# Patient Record
Sex: Female | Born: 1960
Health system: Southern US, Community
[De-identification: ages and names within clinical notes are randomized; demographics above are authoritative.]

## PROBLEM LIST (undated history)

## (undated) DIAGNOSIS — K219 Gastro-esophageal reflux disease without esophagitis: Secondary | ICD-10-CM

## (undated) DIAGNOSIS — E785 Hyperlipidemia, unspecified: Secondary | ICD-10-CM

## (undated) DIAGNOSIS — Z86010 Personal history of colonic polyps: Secondary | ICD-10-CM

## (undated) DIAGNOSIS — I10 Essential (primary) hypertension: Secondary | ICD-10-CM

## (undated) DIAGNOSIS — T7840XA Allergy, unspecified, initial encounter: Secondary | ICD-10-CM

## (undated) HISTORY — DX: Allergy, unspecified, initial encounter: T78.40XA

## (undated) HISTORY — PX: COLONOSCOPY: SHX174

## (undated) HISTORY — PX: LAPAROSCOPIC TOTAL HYSTERECTOMY: SUR800

## (undated) HISTORY — DX: Personal history of colonic polyps: Z86.010

## (undated) HISTORY — DX: Gastro-esophageal reflux disease without esophagitis: K21.9

## (undated) HISTORY — DX: Hyperlipidemia, unspecified: E78.5

## (undated) HISTORY — PX: HEMORRHOID SURGERY: SHX153

---

## 1998-08-30 ENCOUNTER — Other Ambulatory Visit: Admission: RE | Admit: 1998-08-30 | Discharge: 1998-08-30 | Payer: Self-pay | Admitting: Obstetrics and Gynecology

## 1998-11-05 ENCOUNTER — Observation Stay (HOSPITAL_COMMUNITY): Admission: RE | Admit: 1998-11-05 | Discharge: 1998-11-06 | Payer: Self-pay | Admitting: Obstetrics and Gynecology

## 1999-11-07 ENCOUNTER — Other Ambulatory Visit: Admission: RE | Admit: 1999-11-07 | Discharge: 1999-11-07 | Payer: Self-pay | Admitting: Obstetrics and Gynecology

## 2000-10-21 ENCOUNTER — Ambulatory Visit (HOSPITAL_COMMUNITY): Admission: RE | Admit: 2000-10-21 | Discharge: 2000-10-21 | Payer: Self-pay | Admitting: Obstetrics and Gynecology

## 2001-01-04 ENCOUNTER — Other Ambulatory Visit: Admission: RE | Admit: 2001-01-04 | Discharge: 2001-01-04 | Payer: Self-pay | Admitting: Obstetrics and Gynecology

## 2001-06-30 ENCOUNTER — Ambulatory Visit (HOSPITAL_COMMUNITY): Admission: RE | Admit: 2001-06-30 | Discharge: 2001-06-30 | Payer: Self-pay | Admitting: Obstetrics and Gynecology

## 2002-04-21 ENCOUNTER — Other Ambulatory Visit: Admission: RE | Admit: 2002-04-21 | Discharge: 2002-04-21 | Payer: Self-pay | Admitting: Obstetrics and Gynecology

## 2003-05-31 ENCOUNTER — Other Ambulatory Visit: Admission: RE | Admit: 2003-05-31 | Discharge: 2003-05-31 | Payer: Self-pay | Admitting: Obstetrics and Gynecology

## 2004-05-23 ENCOUNTER — Other Ambulatory Visit: Admission: RE | Admit: 2004-05-23 | Discharge: 2004-05-23 | Payer: Self-pay | Admitting: Obstetrics and Gynecology

## 2004-09-10 ENCOUNTER — Other Ambulatory Visit: Admission: RE | Admit: 2004-09-10 | Discharge: 2004-09-10 | Payer: Self-pay | Admitting: Obstetrics and Gynecology

## 2005-02-20 ENCOUNTER — Other Ambulatory Visit: Admission: RE | Admit: 2005-02-20 | Discharge: 2005-02-20 | Payer: Self-pay | Admitting: Obstetrics and Gynecology

## 2005-08-13 ENCOUNTER — Other Ambulatory Visit: Admission: RE | Admit: 2005-08-13 | Discharge: 2005-08-13 | Payer: Self-pay | Admitting: Obstetrics and Gynecology

## 2005-08-27 ENCOUNTER — Ambulatory Visit: Payer: Self-pay | Admitting: Internal Medicine

## 2005-09-08 ENCOUNTER — Ambulatory Visit: Payer: Self-pay | Admitting: Internal Medicine

## 2012-03-06 ENCOUNTER — Other Ambulatory Visit: Payer: Self-pay | Admitting: Emergency Medicine

## 2012-03-31 ENCOUNTER — Other Ambulatory Visit: Payer: Self-pay | Admitting: Emergency Medicine

## 2012-04-07 ENCOUNTER — Other Ambulatory Visit: Payer: Self-pay | Admitting: Emergency Medicine

## 2012-04-08 NOTE — Telephone Encounter (Signed)
Needs office visit.

## 2012-04-14 ENCOUNTER — Ambulatory Visit: Payer: 59

## 2012-04-14 ENCOUNTER — Other Ambulatory Visit: Payer: Self-pay | Admitting: Emergency Medicine

## 2012-04-14 ENCOUNTER — Encounter: Payer: Self-pay | Admitting: Radiology

## 2012-04-14 ENCOUNTER — Ambulatory Visit (INDEPENDENT_AMBULATORY_CARE_PROVIDER_SITE_OTHER): Payer: 59 | Admitting: Emergency Medicine

## 2012-04-14 VITALS — BP 118/88 | HR 73 | Temp 98.5°F | Resp 16 | Ht 61.75 in | Wt 156.2 lb

## 2012-04-14 DIAGNOSIS — Z0000 Encounter for general adult medical examination without abnormal findings: Secondary | ICD-10-CM

## 2012-04-14 DIAGNOSIS — E785 Hyperlipidemia, unspecified: Secondary | ICD-10-CM

## 2012-04-14 DIAGNOSIS — M542 Cervicalgia: Secondary | ICD-10-CM

## 2012-04-14 DIAGNOSIS — IMO0001 Reserved for inherently not codable concepts without codable children: Secondary | ICD-10-CM

## 2012-04-14 LAB — POCT URINALYSIS DIPSTICK
Ketones, UA: NEGATIVE
Protein, UA: NEGATIVE
Spec Grav, UA: 1.015
pH, UA: 6

## 2012-04-14 LAB — COMPREHENSIVE METABOLIC PANEL
BUN: 12 mg/dL (ref 6–23)
CO2: 28 mEq/L (ref 19–32)
Creat: 0.79 mg/dL (ref 0.50–1.10)
Glucose, Bld: 89 mg/dL (ref 70–99)
Total Bilirubin: 0.5 mg/dL (ref 0.3–1.2)

## 2012-04-14 LAB — POCT CBC
Granulocyte percent: 51.9 %G (ref 37–80)
HCT, POC: 38.6 % (ref 37.7–47.9)
MCV: 91.8 fL (ref 80–97)
POC LYMPH PERCENT: 41.8 %L (ref 10–50)
RDW, POC: 13.1 %

## 2012-04-14 LAB — LIPID PANEL
Cholesterol: 268 mg/dL — ABNORMAL HIGH (ref 0–200)
HDL: 52 mg/dL (ref >39)
Total CHOL/HDL Ratio: 5.2 Ratio

## 2012-04-14 LAB — POCT UA - MICROSCOPIC ONLY: Crystals, Ur, HPF, POC: NEGATIVE

## 2012-04-14 LAB — TSH: TSH: 1.893 u[IU]/mL (ref 0.350–4.500)

## 2012-04-14 MED ORDER — ROSUVASTATIN CALCIUM 40 MG PO TABS: ORAL_TABLET | ORAL | Status: DC

## 2012-04-14 MED ORDER — OMEPRAZOLE 20 MG PO CPDR: 20.0000 mg | DELAYED_RELEASE_CAPSULE | Freq: Every day | ORAL | Status: DC

## 2012-04-14 NOTE — Progress Notes (Signed)
  Subjective:    Patient ID: Courtney Peterson, female    DOB: 02-15-1961, 51 y.o.   MRN: [id number]  HPI    Review of Systems  Constitutional: Positive for unexpected weight change.  HENT: Positive for ear pain, neck pain and dental problem.   Eyes: Negative.   Respiratory: Negative.   Cardiovascular: Negative.   Gastrointestinal: Negative.   Genitourinary: Positive for pelvic pain.  Musculoskeletal: Positive for back pain.  Skin: Negative.   Neurological: Negative.   Hematological: Negative.   Psychiatric/Behavioral: Negative.        Objective:   Physical Exam  Constitutional: She is oriented to person, place, and time. She appears well-developed and well-nourished.  HENT:  Head: Normocephalic.  Right Ear: External ear normal.  Left Ear: External ear normal.  Eyes: Pupils are equal, round, and reactive to light.  Neck: No tracheal deviation present.       There is tenderness over the neck muscles with diminished range of motion.  Cardiovascular: Normal rate and regular rhythm.  Exam reveals no gallop and no friction rub.   No murmur heard. Pulmonary/Chest: Effort normal and breath sounds normal.  Abdominal: Soft. Bowel sounds are normal. She exhibits no distension and no mass. There is no tenderness. There is no rebound and no guarding.  Musculoskeletal: Normal range of motion.  Neurological: She is alert and oriented to person, place, and time. No cranial nerve deficit.  Skin: Skin is warm and dry.  Psychiatric: She has a normal mood and affect. Her behavior is normal. Judgment and thought content normal.         [facility] reading (PRIMARY) by  Dr.Delmus Warwick no fracture seen no acute disease no arthritis.   Assessment & Plan:  Patient is due for her colonoscopy next year. She will continue on her Crestor 40 mg half tablet a day and Prilosec for reflux. She was given a neck manual to do neck exercises. She has seen her GYN and they will schedule her regular mammograms.

## 2012-04-15 ENCOUNTER — Encounter: Payer: Self-pay | Admitting: Emergency Medicine

## 2012-04-16 LAB — VITAMIN D 25 HYDROXY (VIT D DEFICIENCY, FRACTURES): Vit D, 25-Hydroxy: 28 ng/mL — ABNORMAL LOW (ref 30–89)

## 2012-07-23 ENCOUNTER — Encounter: Payer: Self-pay | Admitting: Internal Medicine

## 2012-08-23 ENCOUNTER — Encounter: Payer: Self-pay | Admitting: Internal Medicine

## 2012-10-21 ENCOUNTER — Encounter: Payer: Self-pay | Admitting: Internal Medicine

## 2012-12-09 ENCOUNTER — Ambulatory Visit (AMBULATORY_SURGERY_CENTER): Payer: 59

## 2012-12-09 VITALS — Ht 62.5 in | Wt 164.8 lb

## 2012-12-09 DIAGNOSIS — Z860100 Personal history of colon polyps, unspecified: Secondary | ICD-10-CM

## 2012-12-09 DIAGNOSIS — Z1211 Encounter for screening for malignant neoplasm of colon: Secondary | ICD-10-CM

## 2012-12-09 MED ORDER — NA SULFATE-K SULFATE-MG SULF 17.5-3.13-1.6 GM/177ML PO SOLN: 1.0000 | Freq: Once | ORAL | Status: DC

## 2012-12-10 ENCOUNTER — Encounter: Payer: Self-pay | Admitting: Internal Medicine

## 2012-12-27 ENCOUNTER — Ambulatory Visit (AMBULATORY_SURGERY_CENTER): Payer: 59 | Admitting: Internal Medicine

## 2012-12-27 ENCOUNTER — Encounter: Payer: Self-pay | Admitting: Internal Medicine

## 2012-12-27 VITALS — BP 102/74 | HR 52 | Temp 96.9°F | Resp 9 | Ht 62.0 in | Wt 164.0 lb

## 2012-12-27 DIAGNOSIS — Z1211 Encounter for screening for malignant neoplasm of colon: Secondary | ICD-10-CM

## 2012-12-27 DIAGNOSIS — D126 Benign neoplasm of colon, unspecified: Secondary | ICD-10-CM

## 2012-12-27 DIAGNOSIS — Z860100 Personal history of colon polyps, unspecified: Secondary | ICD-10-CM

## 2012-12-27 HISTORY — DX: Personal history of colonic polyps: Z86.010

## 2012-12-27 HISTORY — DX: Personal history of colon polyps, unspecified: Z86.0100

## 2012-12-27 MED ORDER — SODIUM CHLORIDE 0.9 % IV SOLN: 500.0000 mL | INTRAVENOUS | Status: DC

## 2012-12-27 NOTE — Op Note (Addendum)
[facility] [address] [place], [address]   COLONOSCOPY PROCEDURE REPORT  PATIENT: Peterson, Courtney J.  MR#: [id number] BIRTHDATE: 05-Sep-1961 , 52  yrs. old GENDER: Female ENDOSCOPIST: Iva Boop, MD, [facility] PROCEDURE DATE:  12/27/2012 PROCEDURE:   Colonoscopy with biopsy ASA CLASS:   Class II INDICATIONS:Screening and surveillance,personal history of colonic polyps. MEDICATIONS: propofol (Diprivan) 300mg  IV, MAC sedation, administered by CRNA, and These medications were titrated to patient response per physician's verbal order  DESCRIPTION OF PROCEDURE:   After the risks benefits and alternatives of the procedure were thoroughly explained, informed consent was obtained.  A digital rectal exam revealed no abnormalities of the rectum.   The LB CF-[id number]  endoscope was introduced through the anus and advanced to the cecum, which was identified by both the appendix and ileocecal valve. No adverse events experienced.   The quality of the prep was Suprep excellent The instrument was then slowly withdrawn as the colon was fully examined.      COLON FINDINGS: Two diminutive smooth sessile polyps were found at the cecum.  A polypectomy was performed with cold forceps.  The resection was complete and the polyp tissue was completely retrieved.   The colon mucosa was otherwise normal.   A right colon retroflexion was performed.  Retroflexed views revealed no abnormalities. The time to cecum=2 minutes 05 seconds.  Withdrawal time=9 minutes 40 seconds.  The scope was withdrawn and the procedure completed. COMPLICATIONS: There were no complications.  ENDOSCOPIC IMPRESSION: 1.   Two diminutive sessile polyps were found at the cecum; polypectomy was performed with cold forceps 2.   The colon mucosa was otherwise normal - excellent prep  RECOMMENDATIONS: Timing of repeat colonoscopy will be determined by pathology findings in this patient with prior hx  colon polyps   eSigned:  Iva Boop, MD, [facility] 12/27/2012 10:06 AM Revised: 12/27/2012 10:06 AM  cc: Lesle Chris, MD  Harold Hedge, MD and The Patient

## 2012-12-27 NOTE — Progress Notes (Signed)
Patient did not experience any of the following events: a burn prior to discharge; a fall within the facility; wrong site/side/patient/procedure/implant event; or a hospital transfer or hospital admission upon discharge from the facility. (G8907) Patient did not have preoperative order for IV antibiotic SSI prophylaxis. (G8918)  

## 2012-12-27 NOTE — Patient Instructions (Addendum)
Two tiny (possible) polyps were seen and removed. Otherwise normal colonoscopy with great prep.  I will let you know pathology results and when to have another routine colonoscopy by mail.  Thank you for choosing me and [facility].  Iva Boop, MD, [facility]  Discharge instructions given with  Verbal understanding. Handouts polyps given. Resume previous medications. YOU HAD AN ENDOSCOPIC PROCEDURE TODAY AT THE [facility]: Refer to the procedure report that was given to you for any specific questions about what was found during the examination.  If the procedure report does not answer your questions, please call your gastroenterologist to clarify.  If you requested that your care partner not be given the details of your procedure findings, then the procedure report has been included in a sealed envelope for you to review at your convenience later.  YOU SHOULD EXPECT: Some feelings of bloating in the abdomen. Passage of more gas than usual.  Walking can help get rid of the air that was put into your GI tract during the procedure and reduce the bloating. If you had a lower endoscopy (such as a colonoscopy or flexible sigmoidoscopy) you may notice spotting of blood in your stool or on the toilet paper. If you underwent a bowel prep for your procedure, then you may not have a normal bowel movement for a few days.  DIET: Your first meal following the procedure should be a light meal and then it is ok to progress to your normal diet.  A half-sandwich or bowl of soup is an example of a good first meal.  Heavy or fried foods are harder to digest and may make you feel nauseous or bloated.  Likewise meals heavy in dairy and vegetables can cause extra gas to form and this can also increase the bloating.  Drink plenty of fluids but you should avoid alcoholic beverages for 24 hours.  ACTIVITY: Your care partner should take you home directly after the procedure.  You should plan to  take it easy, moving slowly for the rest of the day.  You can resume normal activity the day after the procedure however you should NOT DRIVE or use heavy machinery for 24 hours (because of the sedation medicines used during the test).    SYMPTOMS TO REPORT IMMEDIATELY: A gastroenterologist can be reached at any hour.  During normal business hours, 8:30 AM to 5:00 PM Monday through Friday, call [phone].  After hours and on weekends, please call the GI answering service at [phone] who will take a message and have the physician on call contact you.   Following lower endoscopy (colonoscopy or flexible sigmoidoscopy):  Excessive amounts of blood in the stool  Significant tenderness or worsening of abdominal pains  Swelling of the abdomen that is new, acute  Fever of 100F or higher  FOLLOW UP: If any biopsies were taken you will be contacted by phone or by letter within the next 1-3 weeks.  Call your gastroenterologist if you have not heard about the biopsies in 3 weeks.  Our staff will call the home number listed on your records the next business day following your procedure to check on you and address any questions or concerns that you may have at that time regarding the information given to you following your procedure. This is a courtesy call and so if there is no answer at the home number and we have not heard from you through the emergency physician on call, we will assume that  you have returned to your regular daily activities without incident.  SIGNATURES/CONFIDENTIALITY: You and/or your care partner have signed paperwork which will be entered into your electronic medical record.  These signatures attest to the fact that that the information above on your After Visit Summary has been reviewed and is understood.  Full responsibility of the confidentiality of this discharge information lies with you and/or your care-partner.

## 2012-12-27 NOTE — Progress Notes (Signed)
Called to room to assist during endoscopic procedure.  Patient ID and intended procedure confirmed with present staff. Received instructions for my participation in the procedure from the performing physician.  

## 2012-12-28 ENCOUNTER — Telehealth: Payer: Self-pay | Admitting: *Deleted

## 2012-12-28 NOTE — Telephone Encounter (Signed)
  Follow up Call-  Call back number 12/27/2012  Post procedure Call Back phone  # [phone]  Permission to leave phone message Yes     Patient questions:  Do you have a fever, pain , or abdominal swelling? no Pain Score  0 *  Have you tolerated food without any problems? yes  Have you been able to return to your normal activities? yes  Do you have any questions about your discharge instructions: Diet   no Medications  no Follow up visit  no  Do you have questions or concerns about your Care? no  Actions: * If pain score is 4 or above: No action needed, pain <4.

## 2012-12-31 ENCOUNTER — Telehealth: Payer: Self-pay

## 2012-12-31 DIAGNOSIS — E785 Hyperlipidemia, unspecified: Secondary | ICD-10-CM

## 2012-12-31 NOTE — Telephone Encounter (Signed)
Patient of Dr. Cleta Alberts. Would like a call back about her cholesterol rx. CB # is [phone].

## 2013-01-02 ENCOUNTER — Encounter: Payer: Self-pay | Admitting: Internal Medicine

## 2013-01-02 NOTE — Progress Notes (Signed)
Quick Note:  Not polyps Repeat colonoscopy 2024 ______

## 2013-01-03 ENCOUNTER — Telehealth: Payer: Self-pay

## 2013-01-03 MED ORDER — ROSUVASTATIN CALCIUM 40 MG PO TABS: ORAL_TABLET | ORAL | Status: DC

## 2013-01-03 NOTE — Telephone Encounter (Signed)
Patient states the quantity of 15 is same cost as 3 month supply, 3 month supply sent in for her,she will call and make appt to follow up with Dr Cleta Alberts

## 2013-01-03 NOTE — Telephone Encounter (Signed)
PATIENT STATES SHE WANTS 40 MG OF CRESTOR FOR 30 DAYS. [phone]

## 2013-01-04 NOTE — Telephone Encounter (Signed)
Yesterday she wanted 90 day  Today she wants 30 day. Called her to advise #[phone] was sent in. She is going to call [facility].

## 2013-06-12 ENCOUNTER — Ambulatory Visit (INDEPENDENT_AMBULATORY_CARE_PROVIDER_SITE_OTHER): Payer: 59 | Admitting: Emergency Medicine

## 2013-06-12 ENCOUNTER — Ambulatory Visit: Payer: 59

## 2013-06-12 VITALS — BP 103/79 | HR 80 | Temp 98.4°F | Resp 18 | Wt 165.0 lb

## 2013-06-12 DIAGNOSIS — Z23 Encounter for immunization: Secondary | ICD-10-CM

## 2013-06-12 DIAGNOSIS — IMO0001 Reserved for inherently not codable concepts without codable children: Secondary | ICD-10-CM

## 2013-06-12 DIAGNOSIS — E785 Hyperlipidemia, unspecified: Secondary | ICD-10-CM | POA: Insufficient documentation

## 2013-06-12 DIAGNOSIS — K219 Gastro-esophageal reflux disease without esophagitis: Secondary | ICD-10-CM

## 2013-06-12 DIAGNOSIS — Z0000 Encounter for general adult medical examination without abnormal findings: Secondary | ICD-10-CM

## 2013-06-12 DIAGNOSIS — M549 Dorsalgia, unspecified: Secondary | ICD-10-CM

## 2013-06-12 DIAGNOSIS — E559 Vitamin D deficiency, unspecified: Secondary | ICD-10-CM | POA: Insufficient documentation

## 2013-06-12 LAB — POCT UA - MICROSCOPIC ONLY
Mucus, UA: NEGATIVE
Yeast, UA: NEGATIVE

## 2013-06-12 LAB — COMPREHENSIVE METABOLIC PANEL
AST: 16 U/L (ref 0–37)
Alkaline Phosphatase: 64 U/L (ref 39–117)
BUN: 14 mg/dL (ref 6–23)
Creat: 0.88 mg/dL (ref 0.50–1.10)
Glucose, Bld: 88 mg/dL (ref 70–99)

## 2013-06-12 LAB — POCT URINALYSIS DIPSTICK
Ketones, UA: NEGATIVE
Nitrite, UA: NEGATIVE
Protein, UA: NEGATIVE
Urobilinogen, UA: 0.2

## 2013-06-12 LAB — LIPID PANEL
Cholesterol: 197 mg/dL (ref 0–200)
HDL: 54 mg/dL (ref >39)
LDL Cholesterol: 113 mg/dL — ABNORMAL HIGH (ref 0–99)
Triglycerides: 151 mg/dL — ABNORMAL HIGH (ref <150)
VLDL: 30 mg/dL (ref 0–40)

## 2013-06-12 LAB — POCT CBC
HCT, POC: 43.6 % (ref 37.7–47.9)
Lymph, poc: 3 (ref 0.6–3.4)
MCH, POC: 28.9 pg (ref 27–31.2)
MCHC: 31.2 g/dL — AB (ref 31.8–35.4)
MCV: 92.5 fL (ref 80–97)
MID (cbc): 0.6 (ref 0–0.9)
POC LYMPH PERCENT: 38.1 %L (ref 10–50)
Platelet Count, POC: 233 10*3/uL (ref 142–424)
RDW, POC: 13.3 %
WBC: 7.8 10*3/uL (ref 4.6–10.2)

## 2013-06-12 MED ORDER — ZOSTER VACCINE LIVE 19400 UNT/0.65ML ~~LOC~~ SOLR: 0.6500 mL | Freq: Once | SUBCUTANEOUS | Status: DC

## 2013-06-12 MED ORDER — OMEPRAZOLE 20 MG PO CPDR: 20.0000 mg | DELAYED_RELEASE_CAPSULE | Freq: Every day | ORAL | Status: DC

## 2013-06-12 MED ORDER — ROSUVASTATIN CALCIUM 40 MG PO TABS: ORAL_TABLET | ORAL | Status: DC

## 2013-06-12 NOTE — Progress Notes (Signed)
Subjective:    Patient ID: Courtney Peterson, female    DOB: 12-18-1960, 52 y.o.   MRN: [id number]  HPI  52 YO female patient comes in today for a routine physical. Pt reports in she is in good health. She was working two jobs. She was recently married to a man in the [place] and she is working on getting him his green card.  Patient reports she will be getting to the eye doctor as she has noticed some blurred vision while watching TV.   Back pain in her mid left side, Knee pain happens once in a while but resolves with rest and ice. Patient reports headaches once in a while. She will take an OTC NSAID and finds relief.  Patient has a history of environmental allergies. She uses Claritin to alleviate the symptoms.   Colonoscopy 11/2012- Normal Mammogram scheduled for November Pap Smear 02/2013- Normal Tetanus within the last 5 years  Needs Shingles vaccine and Flu shot.    Review of Systems  Constitutional: Negative.   Eyes: Positive for visual disturbance. Negative for photophobia, pain, discharge, redness and itching.  Respiratory: Negative.   Cardiovascular: Negative.   Gastrointestinal: Negative.   Endocrine: Negative.   Genitourinary: Negative.   Musculoskeletal: Positive for back pain and joint swelling. Negative for myalgias, arthralgias and gait problem.  Skin: Negative.   Allergic/Immunologic: Positive for environmental allergies. Negative for food allergies and immunocompromised state.  Neurological: Positive for headaches. Negative for dizziness, tremors, seizures, syncope, facial asymmetry, speech difficulty, weakness, light-headedness and numbness.  Hematological: Negative.   Psychiatric/Behavioral: Negative.        Objective:   Physical Exam HEENT exam. Pupils equal regular react to light direct and consensual. Extraocular movements are intact. TMs are clear. Nose is normal. Throat is clear. Chest is clear to auscultation and percussion. Breast exam is  deferred. Heart is regular rate without murmurs rubs or gallops. Abdomen is soft liver and spleen not enlarged there are no areas of tenderness. Extremities without cyanosis clubbing or edema.  [facility] reading (PRIMARY) by  Dr. Cleta Alberts is no acute disease on chest x-ray  Results for orders placed in visit on 06/12/13  POCT URINALYSIS DIPSTICK      Result Value Range   Color, UA yellow     Clarity, UA clear     Glucose, UA neg     Bilirubin, UA neg     Ketones, UA neg     Spec Grav, UA 1.020     Blood, UA neg     pH, UA 7.0     Protein, UA neg     Urobilinogen, UA 0.2     Nitrite, UA neg     Leukocytes, UA Trace    POCT UA - MICROSCOPIC ONLY      Result Value Range   WBC, Ur, HPF, POC 1-2     RBC, urine, microscopic 0-1     Bacteria, U Microscopic trace     Mucus, UA neg     Epithelial cells, urine per micros 2-5     Crystals, Ur, HPF, POC neg     Casts, Ur, LPF, POC neg     Yeast, UA neg    POCT CBC      Result Value Range   WBC 7.8  4.6 - 10.2 K/uL   Lymph, poc 3.0  0.6 - 3.4   POC LYMPH PERCENT 38.1  10 - 50 %L   MID (cbc) 0.6  0 - 0.9  POC MID % 7.9  0 - 12 %M   POC Granulocyte 4.2  2 - 6.9   Granulocyte percent 54.0  37 - 80 %G   RBC 4.71  4.04 - 5.48 M/uL   Hemoglobin 13.6  12.2 - 16.2 g/dL   HCT, POC 16.1  09.6 - 47.9 %   MCV 92.5  80 - 97 fL   MCH, POC 28.9  27 - 31.2 pg   MCHC 31.2 (*) 31.8 - 35.4 g/dL   RDW, POC 04.5     Platelet Count, POC 233  142 - 424 K/uL   MPV 9.3  0 - 99.8 fL          Assessment & Plan:  Shingles vaccine sent to pharmacy. Flu shot ordered for today.

## 2013-06-13 ENCOUNTER — Encounter: Payer: Self-pay | Admitting: *Deleted

## 2013-06-13 ENCOUNTER — Telehealth: Payer: Self-pay

## 2013-06-13 NOTE — Telephone Encounter (Signed)
Pt is calling about lab results Call back number is [phone]

## 2013-06-14 NOTE — Telephone Encounter (Signed)
Called pt, Vit D normal. Pt understood.

## 2014-02-21 ENCOUNTER — Emergency Department: Payer: Self-pay | Admitting: Emergency Medicine

## 2014-06-18 ENCOUNTER — Ambulatory Visit (INDEPENDENT_AMBULATORY_CARE_PROVIDER_SITE_OTHER): Payer: 59

## 2014-06-18 ENCOUNTER — Ambulatory Visit (INDEPENDENT_AMBULATORY_CARE_PROVIDER_SITE_OTHER): Payer: 59 | Admitting: Emergency Medicine

## 2014-06-18 VITALS — BP 112/74 | HR 72 | Temp 97.8°F | Resp 16 | Ht 62.5 in | Wt 169.4 lb

## 2014-06-18 DIAGNOSIS — R635 Abnormal weight gain: Secondary | ICD-10-CM

## 2014-06-18 DIAGNOSIS — M542 Cervicalgia: Secondary | ICD-10-CM

## 2014-06-18 DIAGNOSIS — K2100 Gastro-esophageal reflux disease with esophagitis, without bleeding: Secondary | ICD-10-CM

## 2014-06-18 DIAGNOSIS — IMO0001 Reserved for inherently not codable concepts without codable children: Secondary | ICD-10-CM

## 2014-06-18 DIAGNOSIS — Z23 Encounter for immunization: Secondary | ICD-10-CM

## 2014-06-18 DIAGNOSIS — K219 Gastro-esophageal reflux disease without esophagitis: Secondary | ICD-10-CM

## 2014-06-18 DIAGNOSIS — E785 Hyperlipidemia, unspecified: Secondary | ICD-10-CM

## 2014-06-18 DIAGNOSIS — Z0000 Encounter for general adult medical examination without abnormal findings: Secondary | ICD-10-CM

## 2014-06-18 LAB — POCT URINALYSIS DIPSTICK
Bilirubin, UA: NEGATIVE
Glucose, UA: NEGATIVE
Ketones, UA: NEGATIVE
Nitrite, UA: NEGATIVE
PROTEIN UA: NEGATIVE
RBC UA: NEGATIVE
SPEC GRAV UA: 1.015
UROBILINOGEN UA: 0.2
pH, UA: 7

## 2014-06-18 LAB — POCT CBC
Granulocyte percent: 57.5 %G (ref 37–80)
HEMATOCRIT: 42.9 % (ref 37.7–47.9)
HEMOGLOBIN: 14 g/dL (ref 12.2–16.2)
LYMPH, POC: 3 (ref 0.6–3.4)
MCH: 28.7 pg (ref 27–31.2)
MCHC: 32.5 g/dL (ref 31.8–35.4)
MCV: 88.4 fL (ref 80–97)
MID (cbc): 0.4 (ref 0–0.9)
MPV: 7.7 fL (ref 0–99.8)
POC Granulocyte: 4.6 (ref 2–6.9)
POC LYMPH PERCENT: 37.1 %L (ref 10–50)
POC MID %: 5.4 % (ref 0–12)
Platelet Count, POC: 244 10*3/uL (ref 142–424)
RBC: 4.86 M/uL (ref 4.04–5.48)
RDW, POC: 13.1 %
WBC: 8 10*3/uL (ref 4.6–10.2)

## 2014-06-18 LAB — COMPLETE METABOLIC PANEL WITH GFR
ALBUMIN: 4.6 g/dL (ref 3.5–5.2)
ALK PHOS: 60 U/L (ref 39–117)
ALT: 24 U/L (ref 0–35)
AST: 21 U/L (ref 0–37)
BILIRUBIN TOTAL: 0.6 mg/dL (ref 0.2–1.2)
BUN: 13 mg/dL (ref 6–23)
CO2: 28 mEq/L (ref 19–32)
Calcium: 9.7 mg/dL (ref 8.4–10.5)
Chloride: 104 mEq/L (ref 96–112)
Creat: 0.84 mg/dL (ref 0.50–1.10)
GFR, EST NON AFRICAN AMERICAN: 78 mL/min
GFR, Est African American: >89 mL/min
GLUCOSE: 93 mg/dL (ref 70–99)
Potassium: 4.3 mEq/L (ref 3.5–5.3)
Sodium: 139 mEq/L (ref 135–145)
Total Protein: 7.3 g/dL (ref 6.0–8.3)

## 2014-06-18 LAB — LIPID PANEL
CHOL/HDL RATIO: 3.4 ratio
CHOLESTEROL: 185 mg/dL (ref 0–200)
HDL: 55 mg/dL (ref >39)
LDL Cholesterol: 98 mg/dL (ref 0–99)
Triglycerides: 159 mg/dL — ABNORMAL HIGH (ref <150)
VLDL: 32 mg/dL (ref 0–40)

## 2014-06-18 LAB — TSH: TSH: 1.275 u[IU]/mL (ref 0.350–4.500)

## 2014-06-18 LAB — POCT UA - MICROSCOPIC ONLY
CASTS, UR, LPF, POC: NEGATIVE
CRYSTALS, UR, HPF, POC: NEGATIVE
MUCUS UA: NEGATIVE
Yeast, UA: NEGATIVE

## 2014-06-18 LAB — GLUCOSE, POCT (MANUAL RESULT ENTRY): POC Glucose: 93 mg/dl (ref 70–99)

## 2014-06-18 LAB — T4, FREE: FREE T4: 1.01 ng/dL (ref 0.80–1.80)

## 2014-06-18 MED ORDER — ZOSTER VACCINE LIVE 19400 UNT/0.65ML ~~LOC~~ SOLR: 0.6500 mL | Freq: Once | SUBCUTANEOUS | Status: DC

## 2014-06-18 MED ORDER — ROSUVASTATIN CALCIUM 40 MG PO TABS: ORAL_TABLET | ORAL | Status: DC

## 2014-06-18 MED ORDER — OMEPRAZOLE 20 MG PO CPDR
20.0000 mg | DELAYED_RELEASE_CAPSULE | Freq: Every day | ORAL | Status: DC
Start: 2014-06-18 — End: 2015-06-21

## 2014-06-18 NOTE — Progress Notes (Signed)
Subjective:    Patient ID: Courtney Peterson, female    DOB: [birth year], 53 y.o.   MRN: [id number]  This chart was scribed for Remo Lipps A. Everlene Farrier, MD by Steva Colder, ED Scribe. The patient was seen in room 13 at 9:20 AM.   Chief Complaint  Patient presents with  . Annual Exam    no PAP, pt is fasting   . Flu Vaccine    HPI Courtney Peterson is a 53 y.o. female with a medical hx of GERD who presents today complaining of an annual exam and flu vaccine. She states that she is currently fasting. She states that she is having associated symptoms of neck pain and numbness down the left arm. She states that the neck pain is possibly work related. She denies back pain and any other associated symptoms. She states that her last mammogram was with Dr. Gaetano Net in [place]. She states that she sees her Gynecologist yearly for a pap. She states that she is UTD with her tetanus. She states that she needs her shingles injection. She states that she does not smoke. She states that she does not exercise much. She states that she has had a hysterectomy and hemorrhoid surgery.     Patient Active Problem List   Diagnosis Date Noted  . Other and unspecified hyperlipidemia 06/12/2013  . Unspecified vitamin D deficiency 06/12/2013  . Personal history of colonic polyps 12/27/2012   Past Medical History  Diagnosis Date  . GERD (gastroesophageal reflux disease)   . Hyperlipidemia   . Allergy   . Personal history of colonic polyps 12/27/2012   Past Surgical History  Procedure Laterality Date  . Laparoscopic total hysterectomy      partial first then total hysterectomy in 1996  . Colonoscopy    . Hemorrhoid surgery     Allergies  Allergen Reactions  . Penicillins Rash   Prior to Admission medications   Medication Sig Start Date End Date Taking? Authorizing Provider  Cholecalciferol (VITAMIN D-3) 1000 UNITS CAPS Take by mouth once a week.   Yes Historical Provider, MD  Omega-3 Fatty Acids  (FISH OIL) 300 MG CAPS Take by mouth daily.   Yes Historical Provider, MD  omeprazole (PRILOSEC) 20 MG capsule Take 1 capsule (20 mg total) by mouth daily. 06/12/13  Yes Darlyne Russian, MD  rosuvastatin (CRESTOR) 40 MG tablet Take medication as instructed 06/12/13  Yes Darlyne Russian, MD  vitamin E 400 UNIT capsule Take 400 Units by mouth once a week.   Yes Historical Provider, MD  zoster vaccine live, PF, (ZOSTAVAX) 40981 UNT/0.65ML injection Inject 19,400 Units into the skin once. 06/12/13   Darlyne Russian, MD      Review of Systems  Musculoskeletal: Positive for neck pain. Negative for back pain.  Neurological: Positive for numbness (left arm).       Objective:   Physical Exam  Nursing note and vitals reviewed. Constitutional: She is oriented to person, place, and time. She appears well-developed and well-nourished. No distress.  HENT:  Head: Normocephalic and atraumatic.  Right Ear: External ear normal.  Left Ear: External ear normal.  Mouth/Throat: Oropharynx is clear and moist.  Eyes: EOM are normal.  Neck: Neck supple.  Cardiovascular: Normal rate.   Pulmonary/Chest: Effort normal and breath sounds normal. No respiratory distress.  Abdominal: Soft. There is no tenderness.  Musculoskeletal: Normal range of motion. She exhibits tenderness.  Upper extremities, mild cervical tenderness. reflexes and strength are normal in the upper extremities.  Neurological: She is alert and oriented to person, place, and time.  Skin: Skin is warm and dry.  Psychiatric: She has a normal mood and affect. Her behavior is normal.   Results for orders placed in visit on 06/18/14  POCT CBC      Result Value Ref Range   WBC 8.0  4.6 - 10.2 K/uL   Lymph, poc 3.0  0.6 - 3.4   POC LYMPH PERCENT 37.1  10 - 50 %L   MID (cbc) 0.4  0 - 0.9   POC MID % 5.4  0 - 12 %M   POC Granulocyte 4.6  2 - 6.9   Granulocyte percent 57.5  37 - 80 %G   RBC 4.86  4.04 - 5.48 M/uL   Hemoglobin 14.0  12.2 - 16.2 g/dL    HCT, POC 42.9  37.7 - 47.9 %   MCV 88.4  80 - 97 fL   MCH, POC 28.7  27 - 31.2 pg   MCHC 32.5  31.8 - 35.4 g/dL   RDW, POC 13.1     Platelet Count, POC 244  142 - 424 K/uL   MPV 7.7  0 - 99.8 fL  GLUCOSE, POCT (MANUAL RESULT ENTRY)      Result Value Ref Range   POC Glucose 93  70 - 99 mg/dl   [facility] reading (PRIMARY) by  Dr Everlene Farrier normal     BP 112/74  Pulse 72  Temp(Src) 97.8 F (36.6 C) (Oral)  Resp 16  Ht 5' 2.5" (1.588 m)  Wt 169 lb 6.4 oz (76.839 kg)  BMI 30.47 kg/m2  SpO2 97%  Assessment & Plan:  I personally performed the services described in this documentation, which was scribed in my presence. The recorded information has been reviewed and is accurate.    Pt physical exam is normal. She requested medications for weight loss and I informed her that I did not feel that it would be safe. I advised her to take a baby ASA daily and refilled her current medications. She does have a significant family hx of heart disease.

## 2014-06-18 NOTE — Patient Instructions (Signed)
Exercise to Lose Weight Exercise and a healthy diet may help you lose weight. Your doctor may suggest specific exercises. EXERCISE IDEAS AND TIPS  Choose low-cost things you enjoy doing, such as walking, bicycling, or exercising to workout videos.  Take stairs instead of the elevator.  Walk during your lunch break.  Park your car further away from work or school.  Go to a gym or an exercise class.  Start with 5 to 10 minutes of exercise each day. Build up to 30 minutes of exercise 4 to 6 days a week.  Wear shoes with good support and comfortable clothes.  Stretch before and after working out.  Work out until you breathe harder and your heart beats faster.  Drink extra water when you exercise.  Do not do so much that you hurt yourself, feel dizzy, or get very short of breath. Exercises that burn about 150 calories:  Running 1  miles in 15 minutes.  Playing volleyball for 45 to 60 minutes.  Washing and waxing a car for 45 to 60 minutes.  Playing touch football for 45 minutes.  Walking 1  miles in 35 minutes.  Pushing a stroller 1  miles in 30 minutes.  Playing basketball for 30 minutes.  Raking leaves for 30 minutes.  Bicycling 5 miles in 30 minutes.  Walking 2 miles in 30 minutes.  Dancing for 30 minutes.  Shoveling snow for 15 minutes.  Swimming laps for 20 minutes.  Walking up stairs for 15 minutes.  Bicycling 4 miles in 15 minutes.  Gardening for 30 to 45 minutes.  Jumping rope for 15 minutes.  Washing windows or floors for 45 to 60 minutes. Document Released: 10/18/2010 Document Revised: 12/08/2011 Document Reviewed: 10/18/2010 Exit[employer] Patient Information 2015 [employer]. This information is not intended to replace advice given to you by your health care provider. Make sure you discuss any questions you have with your health care provider.  

## 2014-08-05 ENCOUNTER — Emergency Department: Payer: Self-pay | Admitting: Emergency Medicine

## 2014-09-08 LAB — HM MAMMOGRAPHY

## 2014-11-20 ENCOUNTER — Ambulatory Visit: Payer: Self-pay | Admitting: Orthopedic Surgery

## 2015-03-01 ENCOUNTER — Telehealth: Payer: Self-pay | Admitting: *Deleted

## 2015-03-01 NOTE — Telephone Encounter (Signed)
Received faxed mammo results from 09/08/2014 - no mammographic evidence of malignancy.

## 2015-03-01 NOTE — Telephone Encounter (Signed)
Spoke with Levada Dy in med rec at [facility] for [place] and she is faxing patient's most recent mammo results.  Will update health maintenance once received.  Have already updated care team to include Dr. Gaetano Net.

## 2015-06-21 ENCOUNTER — Other Ambulatory Visit: Payer: Self-pay | Admitting: Emergency Medicine

## 2015-07-03 ENCOUNTER — Encounter: Payer: Self-pay | Admitting: Emergency Medicine

## 2015-07-08 ENCOUNTER — Ambulatory Visit (INDEPENDENT_AMBULATORY_CARE_PROVIDER_SITE_OTHER): Payer: 59 | Admitting: Emergency Medicine

## 2015-07-08 VITALS — BP 128/78 | HR 73 | Temp 98.5°F | Resp 16 | Ht 62.0 in | Wt 167.0 lb

## 2015-07-08 DIAGNOSIS — Z23 Encounter for immunization: Secondary | ICD-10-CM

## 2015-07-08 DIAGNOSIS — N390 Urinary tract infection, site not specified: Secondary | ICD-10-CM | POA: Diagnosis not present

## 2015-07-08 DIAGNOSIS — IMO0001 Reserved for inherently not codable concepts without codable children: Secondary | ICD-10-CM

## 2015-07-08 DIAGNOSIS — E559 Vitamin D deficiency, unspecified: Secondary | ICD-10-CM

## 2015-07-08 DIAGNOSIS — Z0000 Encounter for general adult medical examination without abnormal findings: Secondary | ICD-10-CM | POA: Diagnosis not present

## 2015-07-08 DIAGNOSIS — E785 Hyperlipidemia, unspecified: Secondary | ICD-10-CM | POA: Diagnosis not present

## 2015-07-08 DIAGNOSIS — K219 Gastro-esophageal reflux disease without esophagitis: Secondary | ICD-10-CM | POA: Diagnosis not present

## 2015-07-08 DIAGNOSIS — R8281 Pyuria: Secondary | ICD-10-CM

## 2015-07-08 LAB — POCT CBC
Granulocyte percent: 59.3 %G (ref 37–80)
HCT, POC: 43.1 % (ref 37.7–47.9)
Hemoglobin: 13.6 g/dL (ref 12.2–16.2)
Lymph, poc: 3 (ref 0.6–3.4)
MCH: 27.2 pg (ref 27–31.2)
MCHC: 31.5 g/dL — AB (ref 31.8–35.4)
MCV: 86.3 fL (ref 80–97)
MID (cbc): 0.3 (ref 0–0.9)
MPV: 7.5 fL (ref 0–99.8)
POC Granulocyte: 4.9 (ref 2–6.9)
POC LYMPH %: 36.9 % (ref 10–50)
POC MID %: 3.8 %M (ref 0–12)
Platelet Count, POC: 262 10*3/uL (ref 142–424)
RBC: 5 M/uL (ref 4.04–5.48)
RDW, POC: 13.1 %
WBC: 8.2 10*3/uL (ref 4.6–10.2)

## 2015-07-08 LAB — LIPID PANEL
CHOL/HDL RATIO: 3.3 ratio (ref <=5.0)
CHOLESTEROL: 174 mg/dL (ref 125–200)
HDL: 52 mg/dL (ref >=46)
LDL Cholesterol: 87 mg/dL (ref <130)
TRIGLYCERIDES: 174 mg/dL — AB (ref <150)
VLDL: 35 mg/dL — AB (ref <30)

## 2015-07-08 LAB — COMPLETE METABOLIC PANEL WITH GFR
ALT: 21 U/L (ref 6–29)
AST: 17 U/L (ref 10–35)
Albumin: 4.5 g/dL (ref 3.6–5.1)
Alkaline Phosphatase: 65 U/L (ref 33–130)
BUN: 12 mg/dL (ref 7–25)
CHLORIDE: 106 mmol/L (ref 98–110)
CO2: 28 mmol/L (ref 20–31)
Calcium: 9.7 mg/dL (ref 8.6–10.4)
Creat: 0.88 mg/dL (ref 0.50–1.05)
GFR, Est African American: 85 mL/min (ref >=60)
GFR, Est Non African American: 74 mL/min (ref >=60)
Glucose, Bld: 98 mg/dL (ref 65–99)
POTASSIUM: 4.2 mmol/L (ref 3.5–5.3)
SODIUM: 141 mmol/L (ref 135–146)
Total Bilirubin: 0.4 mg/dL (ref 0.2–1.2)
Total Protein: 7.4 g/dL (ref 6.1–8.1)

## 2015-07-08 LAB — VITAMIN B12: Vitamin B-12: 538 pg/mL (ref 211–911)

## 2015-07-08 LAB — POC MICROSCOPIC URINALYSIS (UMFC)

## 2015-07-08 LAB — MAGNESIUM: MAGNESIUM: 2.2 mg/dL (ref 1.5–2.5)

## 2015-07-08 LAB — POCT URINALYSIS DIP (MANUAL ENTRY)
Bilirubin, UA: NEGATIVE
Glucose, UA: NEGATIVE
Ketones, POC UA: NEGATIVE
Leukocytes, UA: NEGATIVE
Nitrite, UA: NEGATIVE
PH UA: 6
SPEC GRAV UA: 1.025
UROBILINOGEN UA: 0.2

## 2015-07-08 MED ORDER — OMEPRAZOLE 20 MG PO CPDR: DELAYED_RELEASE_CAPSULE | ORAL | Status: AC

## 2015-07-08 MED ORDER — MUPIROCIN 2 % EX OINT: TOPICAL_OINTMENT | CUTANEOUS | Status: AC

## 2015-07-08 MED ORDER — ROSUVASTATIN CALCIUM 40 MG PO TABS: ORAL_TABLET | ORAL | Status: AC

## 2015-07-08 NOTE — Progress Notes (Signed)
This chart was scribed for Courtney Queen, MD by Leandra Kern, Medical Scribe. This patient was seen in Room 4 and the patient's care was started at 8:58 AM.  Chief Complaint:  Chief Complaint  Patient presents with  . Annual Exam    complete physical     HPI: Courtney Peterson is a 54 y.o. female who reports to [facility] today for a complete physical exam.  Pt reports that she is doing well. She notes that she is still working at the same location. Pt reports no new medical conditions or symptoms, or any change to her current medications. Pt notes that she follows up with her GYN physician once every year, her last visit was last dec with no new findings. Pt's last colonoscopy was 3 years ago. Pt notes that she does not do regular physical exercise.   Pt reports having a mild rash on her right shoulder that is improving with time.   Pt states that she is not UTD with her shingles vaccine due to insurance problems.    Past Medical History  Diagnosis Date  . GERD (gastroesophageal reflux disease)   . Hyperlipidemia   . Allergy   . Personal history of colonic polyps 12/27/2012   Past Surgical History  Procedure Laterality Date  . Laparoscopic total hysterectomy      partial first then total hysterectomy in 1996  . Colonoscopy    . Hemorrhoid surgery     Social History   Social History  . Marital Status: Married    Spouse Name: N/A  . Number of Children: N/A  . Years of Education: N/A   Social History Main Topics  . Smoking status: Never Smoker   . Smokeless tobacco: Never Used  . Alcohol Use: No  . Drug Use: No  . Sexual Activity: Yes   Other Topics Concern  . None   Social History Narrative   Family History  Problem Relation Age of Onset  . Diabetes Mother   . Heart disease Mother   . Ulcers Mother   . Hyperlipidemia Mother   . Hypertension Mother   . Liver disease Father   . Heart disease Sister   . Kidney disease Sister   . Diabetes Sister   .  Cancer Sister   . Heart disease Brother   . Hyperlipidemia Brother   . Diabetes Sister   . Lung cancer Sister   . Heart disease Sister   . Hyperlipidemia Brother    Allergies  Allergen Reactions  . Penicillins Rash   Prior to Admission medications   Medication Sig Start Date End Date Taking? Authorizing Provider  Cholecalciferol (VITAMIN D-3) 1000 UNITS CAPS Take by mouth once a week.   Yes Historical Provider, MD  Omega-3 Fatty Acids (FISH OIL) 300 MG CAPS Take by mouth daily.   Yes Historical Provider, MD  omeprazole (PRILOSEC) 20 MG capsule Take 1 capsule (20 mg total) by mouth daily. PATIENT NEEDS OFFICE VISIT FOR ADDITIONAL REFILLS 06/22/15  Yes Darlyne Russian, MD  rosuvastatin (CRESTOR) 40 MG tablet Take medication as instructed 06/18/14  Yes Darlyne Russian, MD  vitamin E 400 UNIT capsule Take 400 Units by mouth once a week.   Yes Historical Provider, MD     ROS: The patient denies fevers, chills, night sweats, unintentional weight loss, chest pain, palpitations, wheezing, dyspnea on exertion, nausea, vomiting, abdominal pain, dysuria, hematuria, melena, numbness, weakness, or tingling.   All other systems have been  reviewed and were otherwise negative with the exception of those mentioned in the HPI and as above.    PHYSICAL EXAM: Filed Vitals:   07/08/15 0836  BP: 128/78  Pulse: 73  Temp: 98.5 F (36.9 C)  Resp: 16   Body mass index is 30.54 kg/(m^2).   General: Alert, no acute distress HEENT:  Normocephalic, atraumatic, oropharynx patent. Eye: Juliette Mangle [facility] Cardiovascular:  Regular rate and rhythm, no rubs murmurs or gallops.  No Carotid bruits, radial pulse intact. No pedal edema.  Respiratory: Clear to auscultation bilaterally.  No wheezes, rales, or rhonchi.  No cyanosis, no use of accessory musculature Abdominal: No organomegaly, abdomen is soft and non-tender, positive bowel sounds.  No masses. Musculoskeletal: Gait intact. No edema, tenderness Skin: There is a  1X1 cm red area with excoriation in the right upper anterior chest over the clavicle.  Neurologic: Facial musculature symmetric. Psychiatric: Patient acts appropriately throughout our interaction. Lymphatic: No cervical or submandibular lymphadenopathy  *Breast and pelvic exams deferred.   LABS:    EKG/XRAY:   Primary read interpreted by Dr. Everlene Farrier at [facility].   ASSESSMENT/PLAN: Meds refilled. Routine labs done. She has regular GYN appointments.  By signing my name below, I, [name], attest that this documentation has been prepared under the direction and in the presence of Courtney Queen, MD.  Leandra Kern, Medical Scribe. 07/08/2015.  9:10 AM.     Johney Maine sideeffects, risk and benefits, and alternatives of medications d/w patient. Patient is aware that all medications have potential sideeffects and we are unable to predict every sideeffect or drug-drug interaction that may occur.  Courtney Queen MD 07/08/2015 8:58 AM

## 2015-07-09 LAB — VITAMIN D 25 HYDROXY (VIT D DEFICIENCY, FRACTURES): Vit D, 25-Hydroxy: 31 ng/mL (ref 30–100)

## 2015-07-09 LAB — URINE CULTURE
Colony Count: NO GROWTH
Organism ID, Bacteria: NO GROWTH

## 2015-11-26 ENCOUNTER — Telehealth: Payer: Self-pay

## 2015-11-26 NOTE — Telephone Encounter (Signed)
Dr Everlene Farrier, pharm sent request to change pt's chol Rx to atorvastatin which is preferred alternative to crestor which is not covered. Do you want to try pt on atorvastatin?

## 2015-11-27 MED ORDER — ATORVASTATIN CALCIUM 40 MG PO TABS: 40.0000 mg | ORAL_TABLET | Freq: Every day | ORAL | Status: AC

## 2015-11-27 NOTE — Telephone Encounter (Signed)
Done

## 2015-11-27 NOTE — Telephone Encounter (Signed)
Yes please try atorvastatin 40 mg 1 at bedtime. She can have #30. She can have refills for one year. Thank you

## 2016-04-17 ENCOUNTER — Other Ambulatory Visit: Payer: Self-pay | Admitting: Emergency Medicine

## 2016-07-10 ENCOUNTER — Other Ambulatory Visit: Payer: Self-pay | Admitting: Internal Medicine

## 2016-07-10 DIAGNOSIS — K769 Liver disease, unspecified: Secondary | ICD-10-CM

## 2016-07-21 ENCOUNTER — Ambulatory Visit
Admission: RE | Admit: 2016-07-21 | Discharge: 2016-07-21 | Disposition: A | Discharge status: Home / Self Care | Payer: 59 | Source: Ambulatory Visit | Attending: Internal Medicine | Admitting: Internal Medicine

## 2016-07-21 ENCOUNTER — Ambulatory Visit: Payer: Self-pay

## 2016-07-21 DIAGNOSIS — K7689 Other specified diseases of liver: Secondary | ICD-10-CM | POA: Diagnosis not present

## 2016-07-21 DIAGNOSIS — K769 Liver disease, unspecified: Secondary | ICD-10-CM

## 2016-10-02 DIAGNOSIS — Z1231 Encounter for screening mammogram for malignant neoplasm of breast: Secondary | ICD-10-CM | POA: Diagnosis not present

## 2017-01-05 DIAGNOSIS — E782 Mixed hyperlipidemia: Secondary | ICD-10-CM | POA: Diagnosis not present

## 2017-01-05 DIAGNOSIS — E559 Vitamin D deficiency, unspecified: Secondary | ICD-10-CM | POA: Diagnosis not present

## 2017-03-05 DIAGNOSIS — E559 Vitamin D deficiency, unspecified: Secondary | ICD-10-CM | POA: Diagnosis not present

## 2017-03-05 DIAGNOSIS — R8290 Unspecified abnormal findings in urine: Secondary | ICD-10-CM | POA: Diagnosis not present

## 2017-03-05 DIAGNOSIS — E782 Mixed hyperlipidemia: Secondary | ICD-10-CM | POA: Diagnosis not present

## 2017-03-05 DIAGNOSIS — Z79899 Other long term (current) drug therapy: Secondary | ICD-10-CM | POA: Diagnosis not present

## 2017-03-18 DIAGNOSIS — E559 Vitamin D deficiency, unspecified: Secondary | ICD-10-CM | POA: Diagnosis not present

## 2017-05-06 DIAGNOSIS — Z01419 Encounter for gynecological examination (general) (routine) without abnormal findings: Secondary | ICD-10-CM | POA: Diagnosis not present

## 2017-06-11 DIAGNOSIS — J069 Acute upper respiratory infection, unspecified: Secondary | ICD-10-CM | POA: Diagnosis not present

## 2017-06-11 DIAGNOSIS — H6691 Otitis media, unspecified, right ear: Secondary | ICD-10-CM | POA: Diagnosis not present

## 2017-07-14 DIAGNOSIS — I10 Essential (primary) hypertension: Secondary | ICD-10-CM | POA: Diagnosis not present

## 2017-07-14 DIAGNOSIS — E782 Mixed hyperlipidemia: Secondary | ICD-10-CM | POA: Diagnosis not present

## 2017-07-14 DIAGNOSIS — Z23 Encounter for immunization: Secondary | ICD-10-CM | POA: Diagnosis not present

## 2017-07-14 DIAGNOSIS — K219 Gastro-esophageal reflux disease without esophagitis: Secondary | ICD-10-CM | POA: Diagnosis not present

## 2017-08-25 DIAGNOSIS — E559 Vitamin D deficiency, unspecified: Secondary | ICD-10-CM | POA: Diagnosis not present

## 2017-08-25 DIAGNOSIS — Z79899 Other long term (current) drug therapy: Secondary | ICD-10-CM | POA: Diagnosis not present

## 2017-08-25 DIAGNOSIS — E782 Mixed hyperlipidemia: Secondary | ICD-10-CM | POA: Diagnosis not present

## 2017-10-08 DIAGNOSIS — Z1231 Encounter for screening mammogram for malignant neoplasm of breast: Secondary | ICD-10-CM | POA: Diagnosis not present

## 2017-12-03 DIAGNOSIS — E782 Mixed hyperlipidemia: Secondary | ICD-10-CM | POA: Diagnosis not present

## 2017-12-03 DIAGNOSIS — Z79899 Other long term (current) drug therapy: Secondary | ICD-10-CM | POA: Diagnosis not present

## 2017-12-03 DIAGNOSIS — E559 Vitamin D deficiency, unspecified: Secondary | ICD-10-CM | POA: Diagnosis not present

## 2017-12-04 DIAGNOSIS — J069 Acute upper respiratory infection, unspecified: Secondary | ICD-10-CM | POA: Diagnosis not present

## 2017-12-04 DIAGNOSIS — R112 Nausea with vomiting, unspecified: Secondary | ICD-10-CM | POA: Diagnosis not present

## 2017-12-04 DIAGNOSIS — R6889 Other general symptoms and signs: Secondary | ICD-10-CM | POA: Diagnosis not present

## 2017-12-10 DIAGNOSIS — Z0000 Encounter for general adult medical examination without abnormal findings: Secondary | ICD-10-CM | POA: Diagnosis not present

## 2017-12-10 DIAGNOSIS — I10 Essential (primary) hypertension: Secondary | ICD-10-CM | POA: Diagnosis not present

## 2017-12-10 DIAGNOSIS — E782 Mixed hyperlipidemia: Secondary | ICD-10-CM | POA: Diagnosis not present

## 2018-02-19 DIAGNOSIS — Z86010 Personal history of colonic polyps: Secondary | ICD-10-CM | POA: Diagnosis not present

## 2018-03-15 DIAGNOSIS — K5730 Diverticulosis of large intestine without perforation or abscess without bleeding: Secondary | ICD-10-CM | POA: Diagnosis not present

## 2018-03-15 DIAGNOSIS — K648 Other hemorrhoids: Secondary | ICD-10-CM | POA: Diagnosis not present

## 2018-03-15 DIAGNOSIS — K640 First degree hemorrhoids: Secondary | ICD-10-CM | POA: Diagnosis not present

## 2018-03-15 DIAGNOSIS — Z86010 Personal history of colonic polyps: Secondary | ICD-10-CM | POA: Diagnosis not present

## 2018-03-18 DIAGNOSIS — I10 Essential (primary) hypertension: Secondary | ICD-10-CM | POA: Diagnosis not present

## 2018-03-18 DIAGNOSIS — E782 Mixed hyperlipidemia: Secondary | ICD-10-CM | POA: Diagnosis not present

## 2018-03-18 DIAGNOSIS — E559 Vitamin D deficiency, unspecified: Secondary | ICD-10-CM | POA: Diagnosis not present

## 2018-06-07 DIAGNOSIS — Z6830 Body mass index (BMI) 30.0-30.9, adult: Secondary | ICD-10-CM | POA: Diagnosis not present

## 2018-06-07 DIAGNOSIS — Z01419 Encounter for gynecological examination (general) (routine) without abnormal findings: Secondary | ICD-10-CM | POA: Diagnosis not present

## 2018-06-17 DIAGNOSIS — E782 Mixed hyperlipidemia: Secondary | ICD-10-CM | POA: Diagnosis not present

## 2018-06-17 DIAGNOSIS — Z79899 Other long term (current) drug therapy: Secondary | ICD-10-CM | POA: Diagnosis not present

## 2018-06-17 DIAGNOSIS — E559 Vitamin D deficiency, unspecified: Secondary | ICD-10-CM | POA: Diagnosis not present

## 2018-06-17 DIAGNOSIS — I10 Essential (primary) hypertension: Secondary | ICD-10-CM | POA: Diagnosis not present

## 2018-06-26 DIAGNOSIS — S0501XA Injury of conjunctiva and corneal abrasion without foreign body, right eye, initial encounter: Secondary | ICD-10-CM | POA: Diagnosis not present

## 2018-07-05 DIAGNOSIS — I10 Essential (primary) hypertension: Secondary | ICD-10-CM | POA: Diagnosis not present

## 2018-07-05 DIAGNOSIS — E782 Mixed hyperlipidemia: Secondary | ICD-10-CM | POA: Diagnosis not present

## 2018-07-05 DIAGNOSIS — E559 Vitamin D deficiency, unspecified: Secondary | ICD-10-CM | POA: Diagnosis not present

## 2018-07-05 DIAGNOSIS — Z23 Encounter for immunization: Secondary | ICD-10-CM | POA: Diagnosis not present

## 2018-07-15 DIAGNOSIS — Z23 Encounter for immunization: Secondary | ICD-10-CM | POA: Diagnosis not present

## 2018-09-13 DIAGNOSIS — H2000 Unspecified acute and subacute iridocyclitis: Secondary | ICD-10-CM | POA: Diagnosis not present

## 2018-09-17 DIAGNOSIS — H18831 Recurrent erosion of cornea, right eye: Secondary | ICD-10-CM | POA: Diagnosis not present

## 2018-10-14 DIAGNOSIS — Z1231 Encounter for screening mammogram for malignant neoplasm of breast: Secondary | ICD-10-CM | POA: Diagnosis not present

## 2019-05-07 ENCOUNTER — Emergency Department: Payer: 59

## 2019-05-07 ENCOUNTER — Encounter: Payer: Self-pay | Admitting: Emergency Medicine

## 2019-05-07 ENCOUNTER — Other Ambulatory Visit: Payer: Self-pay

## 2019-05-07 ENCOUNTER — Emergency Department
Admission: EM | Admit: 2019-05-07 | Discharge: 2019-05-07 | Disposition: A | Discharge status: Home / Self Care | Payer: 59 | Attending: Emergency Medicine | Admitting: Emergency Medicine

## 2019-05-07 DIAGNOSIS — Y939 Activity, unspecified: Secondary | ICD-10-CM | POA: Diagnosis not present

## 2019-05-07 DIAGNOSIS — S060X0A Concussion without loss of consciousness, initial encounter: Secondary | ICD-10-CM | POA: Diagnosis not present

## 2019-05-07 DIAGNOSIS — S199XXA Unspecified injury of neck, initial encounter: Secondary | ICD-10-CM | POA: Diagnosis present

## 2019-05-07 DIAGNOSIS — Z79899 Other long term (current) drug therapy: Secondary | ICD-10-CM | POA: Insufficient documentation

## 2019-05-07 DIAGNOSIS — S161XXA Strain of muscle, fascia and tendon at neck level, initial encounter: Secondary | ICD-10-CM | POA: Insufficient documentation

## 2019-05-07 DIAGNOSIS — Y92410 Unspecified street and highway as the place of occurrence of the external cause: Secondary | ICD-10-CM | POA: Diagnosis not present

## 2019-05-07 DIAGNOSIS — Y999 Unspecified external cause status: Secondary | ICD-10-CM | POA: Insufficient documentation

## 2019-05-07 MED ORDER — CYCLOBENZAPRINE HCL 10 MG PO TABS: 10.0000 mg | ORAL_TABLET | Freq: Three times a day (TID) | ORAL | 0 refills | Status: AC | PRN

## 2019-05-07 MED ORDER — TRAMADOL HCL 50 MG PO TABS: 50.0000 mg | ORAL_TABLET | Freq: Four times a day (QID) | ORAL | 0 refills | Status: AC | PRN

## 2019-05-07 MED ORDER — MELOXICAM 15 MG PO TABS: 15.0000 mg | ORAL_TABLET | Freq: Every day | ORAL | 2 refills | Status: AC

## 2019-05-07 MED ORDER — HYDROCODONE-ACETAMINOPHEN 5-325 MG PO TABS
1.0000 | ORAL_TABLET | Freq: Once | ORAL | Status: AC
  Administered 2019-05-07: 1 via ORAL
  Filled 2019-05-07: qty 1
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
## 2019-05-07 NOTE — Discharge Instructions (Addendum)
Follow-up with your regular doctor for your already scheduled appointment.  Follow-up with Dr. Melrose Nakayama if he continues have headaches after 2 to 3 days.  Return to the emergency department if your headaches are worsening.  Follow-up with orthopedics if you continue to have musculoskeletal pain.  Take your medications as prescribed.  Apply ice to all areas that hurt.  In 3 days she can switch to wet heat followed by ice.
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
## 2023-01-12 ENCOUNTER — Other Ambulatory Visit: Payer: Self-pay | Admitting: Obstetrics and Gynecology

## 2023-01-12 DIAGNOSIS — N6310 Unspecified lump in the right breast, unspecified quadrant: Secondary | ICD-10-CM

## 2023-01-22 ENCOUNTER — Ambulatory Visit
Admission: RE | Admit: 2023-01-22 | Discharge: 2023-01-22 | Disposition: A | Discharge status: Home / Self Care | Payer: 59 | Source: Ambulatory Visit | Attending: Obstetrics and Gynecology | Admitting: Obstetrics and Gynecology

## 2023-01-22 DIAGNOSIS — N6310 Unspecified lump in the right breast, unspecified quadrant: Secondary | ICD-10-CM

## 2023-07-06 ENCOUNTER — Encounter: Payer: Self-pay | Admitting: Gastroenterology

## 2023-07-07 ENCOUNTER — Encounter: Payer: Self-pay | Admitting: Gastroenterology

## 2023-07-07 ENCOUNTER — Encounter
Admission: RE | Disposition: A | Discharge status: Home / Self Care | Payer: Self-pay | Source: Home / Self Care | Attending: Gastroenterology

## 2023-07-07 ENCOUNTER — Ambulatory Visit
Admission: RE | Admit: 2023-07-07 | Discharge: 2023-07-07 | Disposition: A | Discharge status: Home / Self Care | Payer: 59 | Attending: Gastroenterology | Admitting: Gastroenterology

## 2023-07-07 ENCOUNTER — Ambulatory Visit: Payer: 59 | Admitting: Anesthesiology

## 2023-07-07 DIAGNOSIS — E785 Hyperlipidemia, unspecified: Secondary | ICD-10-CM | POA: Insufficient documentation

## 2023-07-07 DIAGNOSIS — Z809 Family history of malignant neoplasm, unspecified: Secondary | ICD-10-CM | POA: Diagnosis not present

## 2023-07-07 DIAGNOSIS — I10 Essential (primary) hypertension: Secondary | ICD-10-CM | POA: Diagnosis not present

## 2023-07-07 DIAGNOSIS — Z860100 Personal history of colon polyps, unspecified: Secondary | ICD-10-CM | POA: Insufficient documentation

## 2023-07-07 DIAGNOSIS — Z1211 Encounter for screening for malignant neoplasm of colon: Secondary | ICD-10-CM | POA: Insufficient documentation

## 2023-07-07 DIAGNOSIS — Z90710 Acquired absence of both cervix and uterus: Secondary | ICD-10-CM | POA: Diagnosis not present

## 2023-07-07 DIAGNOSIS — K219 Gastro-esophageal reflux disease without esophagitis: Secondary | ICD-10-CM | POA: Diagnosis not present

## 2023-07-07 DIAGNOSIS — K5730 Diverticulosis of large intestine without perforation or abscess without bleeding: Secondary | ICD-10-CM | POA: Diagnosis not present

## 2023-07-07 DIAGNOSIS — K6389 Other specified diseases of intestine: Secondary | ICD-10-CM | POA: Diagnosis not present

## 2023-07-07 HISTORY — DX: Essential (primary) hypertension: I10

## 2023-07-07 HISTORY — PX: BIOPSY: SHX5522

## 2023-07-07 HISTORY — PX: COLONOSCOPY WITH PROPOFOL: SHX5780

## 2023-07-07 SURGERY — COLONOSCOPY WITH PROPOFOL
Anesthesia: General

## 2023-07-07 MED ORDER — PROPOFOL 10 MG/ML IV BOLUS
INTRAVENOUS | Status: DC | PRN
  Administered 2023-07-07: 30 mg via INTRAVENOUS
  Administered 2023-07-07: 100 mg via INTRAVENOUS

## 2023-07-07 MED ORDER — PHENYLEPHRINE 80 MCG/ML (10ML) SYRINGE FOR IV PUSH (FOR BLOOD PRESSURE SUPPORT)
PREFILLED_SYRINGE | INTRAVENOUS | Status: AC
  Filled 2023-07-07: qty 10

## 2023-07-07 MED ORDER — SODIUM CHLORIDE 0.9 % IV SOLN
INTRAVENOUS | Status: DC | PRN
Start: 2023-07-07 — End: 2023-07-07

## 2023-07-07 MED ORDER — LIDOCAINE HCL (PF) 2 % IJ SOLN
INTRAMUSCULAR | Status: DC | PRN
Start: 2023-07-07 — End: 2023-07-07
  Administered 2023-07-07: 20 mg via INTRADERMAL

## 2023-07-07 MED ORDER — PROPOFOL 500 MG/50ML IV EMUL
INTRAVENOUS | Status: DC | PRN
  Administered 2023-07-07: 125 ug/kg/min via INTRAVENOUS

## 2023-07-07 MED ORDER — PHENYLEPHRINE HCL (PRESSORS) 10 MG/ML IV SOLN
INTRAVENOUS | Status: DC | PRN
Start: 2023-07-07 — End: 2023-07-07
  Administered 2023-07-07: 160 ug via INTRAVENOUS

## 2023-07-07 MED ORDER — SODIUM CHLORIDE 0.9 % IV SOLN: INTRAVENOUS | Status: DC

## 2023-07-07 MED ORDER — PROPOFOL 1000 MG/100ML IV EMUL
INTRAVENOUS | Status: AC
  Filled 2023-07-07: qty 100

## 2023-07-07 NOTE — Op Note (Signed)
[facility] Patient Name: Courtney Peterson Procedure Date: 07/07/2023 11:20 AM MRN: [id number] Account #: [id number] Date of Birth: April 23, 1961 Admit Type: Outpatient Age: 62 Room: [facility] ENDO ROOM 1 Gender: Female Note Status: Finalized Instrument Name: Prentice Docker [id number] Procedure:             Colonoscopy Indications:           High risk colon cancer surveillance: Personal history                         of colonic polyps, Last colonoscopy 5 years ago Providers:             Eather Colas MD, MD Referring MD:          Eather Colas MD, MD (Referring MD) Medicines:             Monitored Anesthesia Care Complications:         No immediate complications. Estimated blood loss:                         Minimal. Procedure:             Pre-Anesthesia Assessment:                        - Prior to the procedure, a History and Physical was                         performed, and patient medications and allergies were                         reviewed. The patient is competent. The risks and                         benefits of the procedure and the sedation options and                         risks were discussed with the patient. All questions                         were answered and informed consent was obtained.                         Patient identification and proposed procedure were                         verified by the physician, the nurse, the                         anesthesiologist, the anesthetist and the technician                         in the endoscopy suite. Mental Status Examination:                         alert and oriented. Airway Examination: normal                         oropharyngeal airway and neck mobility. Respiratory  Examination: clear to auscultation. CV Examination:                         normal. Prophylactic Antibiotics: The patient does not                         require prophylactic antibiotics.  Prior                         Anticoagulants: The patient has taken no anticoagulant                         or antiplatelet agents. ASA Grade Assessment: II - A                         patient with mild systemic disease. After reviewing                         the risks and benefits, the patient was deemed in                         satisfactory condition to undergo the procedure. The                         anesthesia plan was to use monitored anesthesia care                         (MAC). Immediately prior to administration of                         medications, the patient was re-assessed for adequacy                         to receive sedatives. The heart rate, respiratory                         rate, oxygen saturations, blood pressure, adequacy of                         pulmonary ventilation, and response to care were                         monitored throughout the procedure. The physical                         status of the patient was re-assessed after the                         procedure.                        After obtaining informed consent, the colonoscope was                         passed under direct vision. Throughout the procedure,                         the patient's blood pressure, pulse, and oxygen  saturations were monitored continuously. The                         Colonoscope was introduced through the anus and                         advanced to the the cecum, identified by appendiceal                         orifice and ileocecal valve. The colonoscopy was                         performed without difficulty. The patient tolerated                         the procedure well. The quality of the bowel                         preparation was good. The ileocecal valve, appendiceal                         orifice, and rectum were photographed. Findings:      The perianal and digital rectal examinations were normal.      An area of mildly  congested mucosa was found in the proximal ascending       colon. Biopsies were taken with a cold forceps for histology. Estimated       blood loss was minimal.      A single small-mouthed diverticulum was found in the transverse colon.      The exam was otherwise without abnormality on direct and retroflexion       views. Impression:            - Congested mucosa in the proximal ascending colon.                         Biopsied.                        - Diverticulosis in the transverse colon.                        - The examination was otherwise normal on direct and                         retroflexion views. Recommendation:        - Discharge patient to home.                        - Resume previous diet.                        - Continue present medications.                        - Await pathology results.                        - Repeat colonoscopy for surveillance based on                         pathology results.                        -  Return to referring physician as previously                         scheduled. Procedure Code(s):     --- Professional ---                        [phone], Colonoscopy, flexible; with biopsy, single or                         multiple Diagnosis Code(s):     --- Professional ---                        Z86.010, Personal history of colonic polyps                        K63.89, Other specified diseases of intestine                        K57.30, Diverticulosis of large intestine without                         perforation or abscess without bleeding CPT copyright 2022 American Medical Association. All rights reserved. The codes documented in this report are preliminary and upon coder review may  be revised to meet current compliance requirements. Eather Colas MD, MD 07/07/2023 12:03:06 PM Number of Addenda: 0 Note Initiated On: 07/07/2023 11:20 AM Scope Withdrawal Time: 0 hours 6 minutes 45 seconds  Total Procedure Duration: 0 hours 9 minutes  56 seconds  Estimated Blood Loss:  Estimated blood loss was minimal.      [facility]

## 2023-07-07 NOTE — Anesthesia Postprocedure Evaluation (Signed)
Anesthesia Post Note  Patient: Courtney Peterson  Procedure(s) Performed: COLONOSCOPY WITH PROPOFOL BIOPSY  Anesthesia Type: General Anesthetic complications: no   No notable events documented.   Last Vitals:  Vitals:   07/07/23 1205 07/07/23 1215  BP: 92/75 107/85  Pulse: 67 64  Resp: 15 14  Temp:    SpO2: 98% 100%    Last Pain:  Vitals:   07/07/23 1215  TempSrc:   PainSc: 0-No pain                 VAN STAVEREN,Rory Montel

## 2023-07-07 NOTE — Transfer of Care (Signed)
Immediate Anesthesia Transfer of Care Note  Patient: Courtney Peterson  Procedure(s) Performed: COLONOSCOPY WITH PROPOFOL BIOPSY  Patient Location: PACU  Anesthesia Type:General  Level of Consciousness: drowsy  Airway & Oxygen Therapy: Patient Spontanous Breathing  Post-op Assessment: Report given to RN and Post -op Vital signs reviewed and stable  Post vital signs: Reviewed and stable  Last Vitals:  Vitals Value Taken Time  BP 101/74 07/07/23 1159  Temp 35.9 C 07/07/23 1155  Pulse 56 07/07/23 1159  Resp 18 07/07/23 1159  SpO2 97 % 07/07/23 1159  Vitals shown include unfiled device data.  Last Pain:  Vitals:   07/07/23 1155  TempSrc:   PainSc: Asleep         Complications: No notable events documented.

## 2023-07-07 NOTE — H&P (Signed)
Outpatient short stay form Pre-procedure 07/07/2023  Regis Bill, MD  Primary Physician: Marguarite Arbour, MD  Reason for visit:  History of polyps  History of present illness:    62 y/o lady with history of hypertension, HLD, and GERD here for colonoscopy due to history of polyps of unknown type. Last colonoscopy in 2019 was unremarkable. No blood thinners. History of hysterectomy. Family history of a variety of cancers, unclear if colon was a primary in one sister.   No current facility-administered medications for this encounter.  Medications Prior to Admission  Medication Sig Dispense Refill Last Dose   atorvastatin (LIPITOR) 40 MG tablet Take 1 tablet (40 mg total) by mouth daily. 30 tablet 11 Past Week   benazepril (LOTENSIN) 20 MG tablet Take 20 mg by mouth daily.   07/07/2023   omeprazole (PRILOSEC) 20 MG capsule Take one tablet daily 30 capsule 11 07/07/2023   Cholecalciferol (VITAMIN D-3) 1000 UNITS CAPS Take by mouth once a week.      cyclobenzaprine (FLEXERIL) 10 MG tablet Take 1 tablet (10 mg total) by mouth 3 (three) times daily as needed. 30 tablet 0    mupirocin ointment (BACTROBAN) 2 % Apply twice a day. 22 g 0    Omega-3 Fatty Acids (FISH OIL) 300 MG CAPS Take by mouth daily.      rosuvastatin (CRESTOR) 40 MG tablet Take medication as instructed 30 tablet 11    traMADol (ULTRAM) 50 MG tablet Take 1 tablet (50 mg total) by mouth every 6 (six) hours as needed. 15 tablet 0    vitamin E 400 UNIT capsule Take 400 Units by mouth once a week.   07/04/2023     Allergies  Allergen Reactions   Penicillin G Rash   Penicillins Rash     Past Medical History:  Diagnosis Date   Allergy    GERD (gastroesophageal reflux disease)    Hyperlipidemia    Hypertension    Personal history of colonic polyps 12/27/2012    Review of systems:  Otherwise negative.    Physical Exam  Gen: Alert, oriented. Appears stated age.  HEENT: PERRLA. Lungs: No respiratory  distress CV: RRR Abd: soft, benign, no masses Ext: No edema    Planned procedures: Proceed with colonoscopy. The patient understands the nature of the planned procedure, indications, risks, alternatives and potential complications including but not limited to bleeding, infection, perforation, damage to internal organs and possible oversedation/side effects from anesthesia. The patient agrees and gives consent to proceed.  Please refer to procedure notes for findings, recommendations and patient disposition/instructions.     Regis Bill, MD [facility] Gastroenter[facility]

## 2023-07-07 NOTE — Anesthesia Preprocedure Evaluation (Signed)
Anesthesia Evaluation  Patient identified by MRN, date of birth, ID band Patient awake    Reviewed: Allergy & Precautions, NPO status , Patient's Chart, lab work & pertinent test results  Airway Mallampati: II  TM Distance: >3 FB Neck ROM: full    Dental  (+) Teeth Intact   Pulmonary neg pulmonary ROS   Pulmonary exam normal breath sounds clear to auscultation       Cardiovascular Exercise Tolerance: Good hypertension, Pt. on medications negative cardio ROS Normal cardiovascular exam Rhythm:Regular Rate:Normal     Neuro/Psych negative neurological ROS  negative psych ROS   GI/Hepatic negative GI ROS, Neg liver ROS,GERD  Medicated,,  Endo/Other  negative endocrine ROS    Renal/GU negative Renal ROS  negative genitourinary   Musculoskeletal   Abdominal Normal abdominal exam  (+)   Peds negative pediatric ROS (+)  Hematology negative hematology ROS (+)   Anesthesia Other Findings Past Medical History: No date: Allergy No date: GERD (gastroesophageal reflux disease) No date: Hyperlipidemia No date: Hypertension 12/27/2012: Personal history of colonic polyps  Past Surgical History: No date: COLONOSCOPY No date: HEMORRHOID SURGERY No date: LAPAROSCOPIC TOTAL HYSTERECTOMY     Comment:  partial first then total hysterectomy in 1996  BMI    Body Mass Index: 33.33 kg/m      Reproductive/Obstetrics negative OB ROS                             Anesthesia Physical Anesthesia Plan  ASA: 2  Anesthesia Plan: General   Post-op Pain Management:    Induction: Intravenous  PONV Risk Score and Plan: Propofol infusion and TIVA  Airway Management Planned: Natural Airway and Nasal Cannula  Additional Equipment:   Intra-op Plan:   Post-operative Plan:   Informed Consent: I have reviewed the patients History and Physical, chart, labs and discussed the procedure including the risks,  benefits and alternatives for the proposed anesthesia with the patient or authorized representative who has indicated his/her understanding and acceptance.     Dental Advisory Given  Plan Discussed with: Anesthesiologist, CRNA and Surgeon  Anesthesia Plan Comments:        Anesthesia Quick Evaluation

## 2023-07-07 NOTE — Interval H&P Note (Signed)
History and Physical Interval Note:  07/07/2023 11:34 AM  Courtney Peterson  has presented today for surgery, with the diagnosis of V16.0 (ICD-9-CM) - Z80.0 (ICD-10-CM) - FH: liver cancer.  The various methods of treatment have been discussed with the patient and family. After consideration of risks, benefits and other options for treatment, the patient has consented to  Procedure(s): COLONOSCOPY WITH PROPOFOL (N/A) as a surgical intervention.  The patient's history has been reviewed, patient examined, no change in status, stable for surgery.  I have reviewed the patient's chart and labs.  Questions were answered to the patient's satisfaction.     Regis Bill  Ok to proceed with colonoscopy

## 2023-07-08 ENCOUNTER — Encounter: Payer: Self-pay | Admitting: Gastroenterology

## 2023-07-08 LAB — SURGICAL PATHOLOGY

## 2023-10-01 DIAGNOSIS — K760 Fatty (change of) liver, not elsewhere classified: Secondary | ICD-10-CM | POA: Diagnosis not present

## 2023-10-01 DIAGNOSIS — Z87898 Personal history of other specified conditions: Secondary | ICD-10-CM | POA: Diagnosis not present

## 2023-10-01 DIAGNOSIS — E66811 Obesity, class 1: Secondary | ICD-10-CM | POA: Diagnosis not present

## 2023-10-01 DIAGNOSIS — Z860101 Personal history of adenomatous and serrated colon polyps: Secondary | ICD-10-CM | POA: Diagnosis not present

## 2023-11-05 DIAGNOSIS — J101 Influenza due to other identified influenza virus with other respiratory manifestations: Secondary | ICD-10-CM | POA: Diagnosis not present

## 2023-11-05 DIAGNOSIS — Z03818 Encounter for observation for suspected exposure to other biological agents ruled out: Secondary | ICD-10-CM | POA: Diagnosis not present

## 2023-11-10 DIAGNOSIS — R053 Chronic cough: Secondary | ICD-10-CM | POA: Diagnosis not present

## 2023-11-10 DIAGNOSIS — I10 Essential (primary) hypertension: Secondary | ICD-10-CM | POA: Diagnosis not present

## 2023-11-10 DIAGNOSIS — E66811 Obesity, class 1: Secondary | ICD-10-CM | POA: Diagnosis not present

## 2023-11-10 DIAGNOSIS — Z0000 Encounter for general adult medical examination without abnormal findings: Secondary | ICD-10-CM | POA: Diagnosis not present

## 2023-11-10 DIAGNOSIS — K219 Gastro-esophageal reflux disease without esophagitis: Secondary | ICD-10-CM | POA: Diagnosis not present

## 2023-11-10 DIAGNOSIS — E559 Vitamin D deficiency, unspecified: Secondary | ICD-10-CM | POA: Diagnosis not present

## 2023-11-10 DIAGNOSIS — K760 Fatty (change of) liver, not elsewhere classified: Secondary | ICD-10-CM | POA: Diagnosis not present

## 2023-11-10 DIAGNOSIS — Z79899 Other long term (current) drug therapy: Secondary | ICD-10-CM | POA: Diagnosis not present

## 2023-11-10 DIAGNOSIS — R7303 Prediabetes: Secondary | ICD-10-CM | POA: Diagnosis not present

## 2023-11-10 DIAGNOSIS — E782 Mixed hyperlipidemia: Secondary | ICD-10-CM | POA: Diagnosis not present

## 2024-02-23 ENCOUNTER — Encounter: Payer: Self-pay | Admitting: Licensed Clinical Social Worker

## 2024-03-08 ENCOUNTER — Inpatient Hospital Stay: Payer: Self-pay

## 2024-03-08 ENCOUNTER — Inpatient Hospital Stay: Payer: Self-pay | Attending: Oncology | Admitting: Licensed Clinical Social Worker

## 2024-03-08 ENCOUNTER — Other Ambulatory Visit: Payer: Self-pay | Admitting: Licensed Clinical Social Worker

## 2024-03-08 DIAGNOSIS — Z8042 Family history of malignant neoplasm of prostate: Secondary | ICD-10-CM

## 2024-03-08 DIAGNOSIS — Z803 Family history of malignant neoplasm of breast: Secondary | ICD-10-CM

## 2024-03-08 DIAGNOSIS — Z800 Family history of malignant neoplasm of digestive organs: Secondary | ICD-10-CM

## 2024-03-08 DIAGNOSIS — Z8041 Family history of malignant neoplasm of ovary: Secondary | ICD-10-CM

## 2024-03-08 LAB — GENETIC SCREENING ORDER

## 2024-03-09 ENCOUNTER — Encounter: Payer: Self-pay | Admitting: Licensed Clinical Social Worker

## 2024-03-09 NOTE — Progress Notes (Signed)
 REFERRING PROVIDER: Rhina Center, NP No address on file  PRIMARY PROVIDER:  Yehuda Helms, MD  PRIMARY REASON FOR VISIT:  1. Family history of breast cancer   2. Family history of pancreatic cancer   3. Family history of ovarian cancer   4. Family history of prostate cancer      HISTORY OF PRESENT ILLNESS:   [name], a 63 y.o. female, was seen for a [facility] cancer genetics consultation at the request of Ok Berber, NP due to a family history of cancer.  Ms. Kable presents to clinic today to discuss the possibility of a hereditary predisposition to cancer, genetic testing, and to further clarify her future cancer risks, as well as potential cancer risks for family members.    CANCER HISTORY:  Courtney Peterson is a 63 y.o. female with no personal history of cancer.    RISK FACTORS:  Menarche was at age 68.  First live birth at age 76.  Ovaries intact: no.  Hysterectomy: yes.  Menopausal status: postmenopausal.  HRT use: 0 years. Mammogram within the last year: yes. Number of breast biopsies: 0.  Past Medical History:  Diagnosis Date   Allergy    GERD (gastroesophageal reflux disease)    Hyperlipidemia    Hypertension    Personal history of colonic polyps 12/27/2012    Past Surgical History:  Procedure Laterality Date   BIOPSY  07/07/2023   Procedure: BIOPSY;  Surgeon: Shane Darling, MD;  Location: [facility] ENDOSCOPY;  Service: Gastroenterology;;   COLONOSCOPY     COLONOSCOPY WITH PROPOFOL  N/A 07/07/2023   Procedure: COLONOSCOPY WITH PROPOFOL ;  Surgeon: Shane Darling, MD;  Location: [facility] ENDOSCOPY;  Service: Gastroenterology;  Laterality: N/A;   HEMORRHOID SURGERY     LAPAROSCOPIC TOTAL HYSTERECTOMY     partial first then total hysterectomy in 1996    FAMILY HISTORY:  We obtained a detailed, 4-generation family history.  Significant diagnoses are listed below: Family History  Problem Relation Age of Onset   Diabetes Mother    Heart  disease Mother    Ulcers Mother    Hyperlipidemia Mother    Hypertension Mother    Liver disease Father    Heart disease Sister    Kidney disease Sister    Diabetes Sister    Bladder Cancer Sister 24   Pancreatic cancer Sister 44   Diabetes Sister    Lung cancer Sister    Heart disease Sister    Heart disease Brother    Hyperlipidemia Brother    Hyperlipidemia Brother    Prostate cancer Maternal Uncle    Ovarian cancer Paternal Aunt    Breast cancer Niece 45   Leukemia Niece 2   Breast cancer Cousin 72   Liver cancer Cousin 28   Breast cancer Cousin 43   Liver cancer Cousin 59   Courtney Peterson has 1 son, 75 and 1 daughter, 40, no cancers. She has 1 brother who had pituitary adenoma. A sister died of lung cancer at 23. Another sister had bladder cancer at 74 and pancreatic cancer at 36 and reportedly negative genetic testing. A niece had breast cancer at 47 in both breasts, unsure if genetic testing. Another niece had leukemia at 2.   Courtney Peterson's mother died at 51. She and Courtney Peterson's father were first cousins. [name]'s maternal uncle had prostate cancer. A maternal cousin had breast cancer at 5. Another cousin had liver cancer.   Courtney Peterson's father died at 11. A paternal  aunt had ovarian cancer at 60. A paternal cousin had breast cancer in her 89s. Another cousin had liver cancer. A cousin had brain cancer.   Courtney Peterson is aware of previous family history of genetic testing for hereditary cancer risks. There is no reported Ashkenazi Jewish ancestry. There is known consanguinity.    GENETIC COUNSELING ASSESSMENT: [name] is a 63 y.o. female with a family history of cancer which is somewhat suggestive of a hereditary cancer syndrome and predisposition to cancer. We, therefore, discussed and recommended the following at today's visit.   DISCUSSION: We discussed that approximately 10% of breast/ovarian cancer is hereditary. Most cases of breast/ovarian  cancer are associated with BRCA1/2 genes, although there are other genes associated with hereditary cancer as well. Cancers and risks are gene specific. We discussed that testing is beneficial for several reasons including knowing about cancer risks, identifying potential screening and risk-reduction options that may be appropriate, and to understand if other family members could be at risk for cancer and allow them to undergo genetic testing.   We reviewed the characteristics, features and inheritance patterns of hereditary cancer syndromes. We also discussed genetic testing, including the appropriate family members to test, the process of testing, insurance coverage and turn-around-time for results. We discussed the implications of a negative, positive and/or variant of uncertain significant result. We recommended Courtney Peterson pursue genetic testing for the [facility] CancerNext+RNA gene panel.   Based on Courtney Peterson's family history of cancer, she meets medical criteria for genetic testing. Though Courtney Peterson is not personally affected, there are no affected family members that are willing/able to undergo hereditary cancer testing.  Therefore, Courtney Peterson the most informative family member available. Despite that she meets criteria, she may still have an out of pocket cost.   We discussed that some people do not want to undergo genetic testing due to fear of genetic discrimination.  A federal law called the Genetic Information Non-Discrimination Act (GINA) of 2008 helps protect individuals against genetic discrimination based on their genetic test results.  It impacts both health insurance and employment.  For health insurance, it protects against increased premiums, being kicked off insurance or being forced to take a test in order to be insured.  For employment it protects against hiring, firing and promoting decisions based on genetic test results.  Health status due to a cancer diagnosis is not  protected under GINA.  This law does not protect life insurance, disability insurance, or other types of insurance.   PLAN: After considering the risks, benefits, and limitations, Courtney Peterson provided informed consent to pursue genetic testing and the blood sample was sent to [facility] for analysis of the CancerNext+RNA panel. Results should be available within approximately 2-3 weeks' time, at which point they will be disclosed by telephone to Ms. Bones, as will any additional recommendations warranted by these results. Ms. Strada will receive a summary of her genetic counseling visit and a copy of her results once available. This information will also be available in Epic.   Ms. Kanzler questions were answered to her satisfaction today. Our contact information was provided should additional questions or concerns arise. Thank you for the referral and allowing us  to share in the care of your patient.   Valri Gee, MS, [facility] Genetic Counselor [email or web address] Phone: [phone]  50 minutes were spent on the date of the encounter in service to the patient including preparation, face-to-face consultation, documentation and care coordination. Dr. Nelson Bandy was available for discussion regarding this  case.   _______________________________________________________________________ For Office Staff:  Number of people involved in session: 2 Was an Intern/ student involved with case: yes; [employer] intern Moira Andrews was present and assisted with this case.

## 2024-03-30 ENCOUNTER — Telehealth: Payer: Self-pay | Admitting: Licensed Clinical Social Worker

## 2024-03-31 ENCOUNTER — Encounter: Payer: Self-pay | Admitting: Licensed Clinical Social Worker

## 2024-03-31 ENCOUNTER — Ambulatory Visit: Payer: Self-pay | Admitting: Licensed Clinical Social Worker

## 2024-03-31 DIAGNOSIS — Z1379 Encounter for other screening for genetic and chromosomal anomalies: Secondary | ICD-10-CM

## 2024-03-31 NOTE — Progress Notes (Signed)
 HPI:   Courtney Peterson was previously seen in the [facility] clinic due to a family history of cancer and concerns regarding a hereditary predisposition to cancer. Please refer to our prior cancer genetics clinic note for more information regarding our discussion, assessment and recommendations, at the time. Courtney Peterson recent genetic test results were disclosed to her, as were recommendations warranted by these results. These results and recommendations are discussed in more detail below.  CANCER HISTORY:  Oncology History   No history exists.    FAMILY HISTORY:  We obtained a detailed, 4-generation family history.  Significant diagnoses are listed below: Family History  Problem Relation Age of Onset   Diabetes Mother    Heart disease Mother    Ulcers Mother    Hyperlipidemia Mother    Hypertension Mother    Liver disease Father    Heart disease Sister    Kidney disease Sister    Diabetes Sister    Bladder Cancer Sister 28   Pancreatic cancer Sister 9   Diabetes Sister    Lung cancer Sister    Heart disease Sister    Heart disease Brother    Hyperlipidemia Brother    Hyperlipidemia Brother    Prostate cancer Maternal Uncle    Ovarian cancer Paternal Aunt    Breast cancer Niece 73   Leukemia Niece 2   Breast cancer Cousin 74   Liver cancer Cousin 65   Breast cancer Cousin 73   Liver cancer Cousin 65   Courtney Peterson has 1 son, 40 and 1 daughter, 33, no cancers. She has 1 brother who had pituitary adenoma. A sister died of lung cancer at 26. Another sister had bladder cancer at 98 and pancreatic cancer at 60 and reportedly negative genetic testing. A niece had breast cancer at 21 in both breasts, unsure if genetic testing. Another niece had leukemia at 2.    Courtney Peterson's mother died at 69. She and Courtney Peterson's father were first cousins. Courtney Peterson's maternal uncle had prostate cancer. A maternal cousin had breast cancer at 4. Another cousin had  liver cancer.    Courtney Peterson's father died at 61. A paternal aunt had ovarian cancer at 58. A paternal cousin had breast cancer in her 22s. Another cousin had liver cancer. A cousin had brain cancer.    Courtney Peterson is aware of previous family history of genetic testing for hereditary cancer risks. There is no reported Ashkenazi Jewish ancestry. There is known consanguinity.      GENETIC TEST RESULTS:  The [facility] CancerNext+RNA Panel found no pathogenic mutations.   The [facility] CancerNext+RNAinsight Panel includes sequencing, rearrangement analysis, and RNA analysis for the following 40 genes: APC, ATM, BAP1, BARD1, BMPR1A, BRCA1, BRCA2, BRIP1, CDH1, CDKN2A, CHEK2, FH, FLCN, MET, MLH1, MSH2, MSH6, MUTYH, NF1, NTHL1, PALB2, PMS2, PTEN, RAD51C, RAD51D, RPS20, SMAD4, STK11, TP53, TSC1, TSC2, and VHL (sequencing and deletion/duplication); AXIN2, HOXB13, MBD4, MSH3, POLD1 and POLE (sequencing only); EPCAM and GREM1 (deletion/duplication only).   The test report has been scanned into EPIC and is located under the Molecular Pathology section of the Results Review tab.  A portion of the result report is included below for reference. Genetic testing reported out on 03/29/2024.      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  At this time, it is unknown if this variant is associated with an increased risk for cancer or if it is benign, but most  uncertain variants are reclassified to benign. It should not be used to make medical management decisions. With time, we suspect the laboratory will determine the significance of this variant, if any. If the laboratory reclassifies this variant, we will attempt to contact Courtney Peterson to discuss it further.   Even though a pathogenic variant was not identified, possible explanations for the cancer in the family may include: There may be no hereditary risk for cancer in the family. The cancers in [name] and/or her family may be sporadic/familial or due to other genetic and environmental factors. There may be a gene mutation in one of these genes that current testing methods cannot detect but that chance is small. There could be another gene that has not yet been discovered, or that we have not yet tested, that is responsible for the cancer diagnoses in the family.  It is also possible there is a hereditary cause for the cancer in the family that Courtney Peterson did not inherit.  Therefore, it is important to remain in touch with cancer genetics in the future so that we can continue to offer Courtney Peterson the most up to date genetic testing.   ADDITIONAL GENETIC TESTING:  We discussed with Courtney Peterson that her genetic testing was fairly extensive.  If there are additional relevant genes identified to increase cancer risk that can be analyzed in the future, we would be happy to discuss and coordinate this testing at that time.   CANCER SCREENING RECOMMENDATIONS:  Courtney Peterson test result is considered negative (normal).  This means that we have not identified a hereditary cause for her family history of cancer at this time.   An individual's cancer risk and medical management are not determined by genetic test results alone. Overall cancer risk assessment incorporates additional factors, including personal medical history, family history, and any available genetic information that may result in a personalized plan for cancer prevention and surveillance. Therefore, it is recommended she continue to follow the cancer management and screening guidelines provided by her  primary healthcare provider.  RECOMMENDATIONS FOR FAMILY MEMBERS:   Since she did not inherit a identifiable mutation in a cancer predisposition gene included on this panel, her children could not have inherited a known mutation from her in one of these genes. Individuals in this family might be at some increased risk of  developing cancer, over the general population risk, due to the family history of cancer.  Individuals in the family should notify their providers of the family history of cancer. We recommend women in this family have a yearly mammogram beginning at age 65, or 24 years younger than the earliest onset of cancer, an annual clinical breast exam, and perform monthly breast self-exams.  Family members should have colonoscopies by at age 40, or earlier, as recommended by their providers. Other members of the family may still carry a pathogenic variant in one of these genes that Courtney Peterson did not inherit. Based on the family history, we recommend her niece/any others who had have had cancer at young ages or relatives closely related to those who had ovarian cancer, have genetic counseling and testing. Courtney Peterson will let us  know if we can be of any assistance in coordinating genetic counseling and/or testing. We do not recommend familial testing for the MLH1 variant of uncertain significance (VUS).  FOLLOW-UP:  Lastly, we discussed with Courtney Peterson that cancer genetics is a rapidly advancing field and it is possible that new genetic tests will  be appropriate for her and/or her family members in the future. We encouraged her to remain in contact with cancer genetics on an annual basis so we can update her personal and family histories and let her know of advances in cancer genetics that may benefit this family.   Our contact number was provided. Ms. Wilinski questions were answered to her satisfaction, and she knows she is welcome to call us  at anytime with additional questions or concerns.    Dena Cary, MS, [facility] Genetic Counselor [email or web address] Phone: [phone]

## 2024-03-31 NOTE — Telephone Encounter (Signed)
 I contacted Ms. Norden to discuss her genetic testing results. No pathogenic variants were identified in the 40 genes analyzed. Detailed clinic note to follow.   The test report has been scanned into EPIC and is located under the Molecular Pathology section of the Results Review tab.  A portion of the result report is included below for reference.      Dena Cary, MS, [facility] Genetic Counselor [email or web address] Phone: [phone]

## 2024-04-28 DIAGNOSIS — M545 Low back pain, unspecified: Secondary | ICD-10-CM | POA: Diagnosis not present

## 2024-04-28 DIAGNOSIS — Z03818 Encounter for observation for suspected exposure to other biological agents ruled out: Secondary | ICD-10-CM | POA: Diagnosis not present

## 2024-04-28 DIAGNOSIS — J029 Acute pharyngitis, unspecified: Secondary | ICD-10-CM | POA: Diagnosis not present

## 2024-04-28 DIAGNOSIS — J069 Acute upper respiratory infection, unspecified: Secondary | ICD-10-CM | POA: Diagnosis not present

## 2024-04-28 DIAGNOSIS — G8929 Other chronic pain: Secondary | ICD-10-CM | POA: Diagnosis not present

## 2024-04-28 DIAGNOSIS — R110 Nausea: Secondary | ICD-10-CM | POA: Diagnosis not present

## 2024-04-28 DIAGNOSIS — N390 Urinary tract infection, site not specified: Secondary | ICD-10-CM | POA: Diagnosis not present

## 2024-04-28 DIAGNOSIS — J039 Acute tonsillitis, unspecified: Secondary | ICD-10-CM | POA: Diagnosis not present

## 2024-04-28 DIAGNOSIS — N898 Other specified noninflammatory disorders of vagina: Secondary | ICD-10-CM | POA: Diagnosis not present

## 2024-05-19 DIAGNOSIS — K219 Gastro-esophageal reflux disease without esophagitis: Secondary | ICD-10-CM | POA: Diagnosis not present

## 2024-05-19 DIAGNOSIS — R8290 Unspecified abnormal findings in urine: Secondary | ICD-10-CM | POA: Diagnosis not present

## 2024-05-19 DIAGNOSIS — E66811 Obesity, class 1: Secondary | ICD-10-CM | POA: Diagnosis not present

## 2024-05-19 DIAGNOSIS — E559 Vitamin D deficiency, unspecified: Secondary | ICD-10-CM | POA: Diagnosis not present

## 2024-05-19 DIAGNOSIS — R519 Headache, unspecified: Secondary | ICD-10-CM | POA: Diagnosis not present

## 2024-05-19 DIAGNOSIS — E782 Mixed hyperlipidemia: Secondary | ICD-10-CM | POA: Diagnosis not present

## 2024-05-19 DIAGNOSIS — R7303 Prediabetes: Secondary | ICD-10-CM | POA: Diagnosis not present

## 2024-05-19 DIAGNOSIS — Z79899 Other long term (current) drug therapy: Secondary | ICD-10-CM | POA: Diagnosis not present

## 2024-05-19 DIAGNOSIS — I10 Essential (primary) hypertension: Secondary | ICD-10-CM | POA: Diagnosis not present

## 2024-08-08 DIAGNOSIS — Z124 Encounter for screening for malignant neoplasm of cervix: Secondary | ICD-10-CM | POA: Diagnosis not present

## 2024-08-08 DIAGNOSIS — Z780 Asymptomatic menopausal state: Secondary | ICD-10-CM | POA: Diagnosis not present

## 2024-08-08 DIAGNOSIS — M8580 Other specified disorders of bone density and structure, unspecified site: Secondary | ICD-10-CM | POA: Diagnosis not present

## 2024-08-08 DIAGNOSIS — Z6832 Body mass index (BMI) 32.0-32.9, adult: Secondary | ICD-10-CM | POA: Diagnosis not present

## 2024-08-08 DIAGNOSIS — N952 Postmenopausal atrophic vaginitis: Secondary | ICD-10-CM | POA: Diagnosis not present

## 2024-08-08 DIAGNOSIS — Z1272 Encounter for screening for malignant neoplasm of vagina: Secondary | ICD-10-CM | POA: Diagnosis not present

## 2024-08-12 DIAGNOSIS — E782 Mixed hyperlipidemia: Secondary | ICD-10-CM | POA: Diagnosis not present

## 2024-08-15 DIAGNOSIS — Z1231 Encounter for screening mammogram for malignant neoplasm of breast: Secondary | ICD-10-CM | POA: Diagnosis not present

## 2024-08-19 DIAGNOSIS — E782 Mixed hyperlipidemia: Secondary | ICD-10-CM | POA: Diagnosis not present

## 2024-08-19 DIAGNOSIS — R10813 Right lower quadrant abdominal tenderness: Secondary | ICD-10-CM | POA: Diagnosis not present

## 2024-08-19 DIAGNOSIS — K219 Gastro-esophageal reflux disease without esophagitis: Secondary | ICD-10-CM | POA: Diagnosis not present

## 2024-08-19 DIAGNOSIS — I10 Essential (primary) hypertension: Secondary | ICD-10-CM | POA: Diagnosis not present

## 2024-08-19 DIAGNOSIS — Z79899 Other long term (current) drug therapy: Secondary | ICD-10-CM | POA: Diagnosis not present

## 2024-08-19 DIAGNOSIS — E559 Vitamin D deficiency, unspecified: Secondary | ICD-10-CM | POA: Diagnosis not present

## 2024-08-19 DIAGNOSIS — R7303 Prediabetes: Secondary | ICD-10-CM | POA: Diagnosis not present

## 2024-08-26 ENCOUNTER — Other Ambulatory Visit: Payer: Self-pay | Admitting: Internal Medicine

## 2024-08-26 DIAGNOSIS — Z0000 Encounter for general adult medical examination without abnormal findings: Secondary | ICD-10-CM

## 2024-08-26 DIAGNOSIS — R10813 Right lower quadrant abdominal tenderness: Secondary | ICD-10-CM

## 2024-09-05 ENCOUNTER — Ambulatory Visit: Admission: RE | Admit: 2024-09-05 | Discharge: 2024-09-05 | Attending: Internal Medicine | Admitting: Internal Medicine

## 2024-09-05 DIAGNOSIS — Z0000 Encounter for general adult medical examination without abnormal findings: Secondary | ICD-10-CM | POA: Diagnosis not present

## 2024-09-05 DIAGNOSIS — R1031 Right lower quadrant pain: Secondary | ICD-10-CM | POA: Diagnosis not present

## 2024-09-05 DIAGNOSIS — R10813 Right lower quadrant abdominal tenderness: Secondary | ICD-10-CM

## 2024-09-05 DIAGNOSIS — D1803 Hemangioma of intra-abdominal structures: Secondary | ICD-10-CM | POA: Diagnosis not present

## 2024-09-05 DIAGNOSIS — K7689 Other specified diseases of liver: Secondary | ICD-10-CM | POA: Diagnosis not present

## 2024-09-05 MED ORDER — IOHEXOL 300 MG/ML  SOLN
100.0000 mL | Freq: Once | INTRAMUSCULAR | Status: AC | PRN
  Administered 2024-09-05: 100 mL via INTRAVENOUS
